# Patient Record
Sex: Female | Born: 1990 | Race: Black or African American | Hispanic: No | Marital: Married | Smoking: Former smoker
Health system: Southern US, Community
[De-identification: ages and names within clinical notes are randomized; demographics above are authoritative.]

## PROBLEM LIST (undated history)

## (undated) ENCOUNTER — Inpatient Hospital Stay (HOSPITAL_COMMUNITY): Payer: Self-pay

## (undated) ENCOUNTER — Inpatient Hospital Stay (HOSPITAL_COMMUNITY): Payer: Medicaid Other

## (undated) DIAGNOSIS — R569 Unspecified convulsions: Secondary | ICD-10-CM

## (undated) DIAGNOSIS — O039 Complete or unspecified spontaneous abortion without complication: Secondary | ICD-10-CM

## (undated) HISTORY — DX: Complete or unspecified spontaneous abortion without complication: O03.9

## (undated) HISTORY — DX: Unspecified convulsions: R56.9

---
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
## 2015-02-12 ENCOUNTER — Encounter: Payer: Self-pay | Admitting: Emergency Medicine

## 2015-02-12 ENCOUNTER — Emergency Department
Admission: EM | Admit: 2015-02-12 | Discharge: 2015-02-12 | Disposition: A | Discharge status: Home / Self Care | Payer: BLUE CROSS/BLUE SHIELD | Attending: Emergency Medicine | Admitting: Emergency Medicine

## 2015-02-12 DIAGNOSIS — Z3202 Encounter for pregnancy test, result negative: Secondary | ICD-10-CM | POA: Insufficient documentation

## 2015-02-12 DIAGNOSIS — M62830 Muscle spasm of back: Secondary | ICD-10-CM | POA: Diagnosis not present

## 2015-02-12 DIAGNOSIS — Y92410 Unspecified street and highway as the place of occurrence of the external cause: Secondary | ICD-10-CM | POA: Insufficient documentation

## 2015-02-12 DIAGNOSIS — Y998 Other external cause status: Secondary | ICD-10-CM | POA: Diagnosis not present

## 2015-02-12 DIAGNOSIS — Y9389 Activity, other specified: Secondary | ICD-10-CM | POA: Diagnosis not present

## 2015-02-12 DIAGNOSIS — F17210 Nicotine dependence, cigarettes, uncomplicated: Secondary | ICD-10-CM | POA: Insufficient documentation

## 2015-02-12 DIAGNOSIS — S3992XA Unspecified injury of lower back, initial encounter: Secondary | ICD-10-CM | POA: Diagnosis present

## 2015-02-12 LAB — POCT PREGNANCY, URINE: Preg Test, Ur: NEGATIVE

## 2015-02-12 MED ORDER — CYCLOBENZAPRINE HCL 10 MG PO TABS: 10.0000 mg | ORAL_TABLET | Freq: Three times a day (TID) | ORAL | Status: DC | PRN

## 2015-02-12 MED ORDER — MELOXICAM 15 MG PO TABS: 15.0000 mg | ORAL_TABLET | Freq: Every day | ORAL | Status: DC
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
## 2015-02-12 NOTE — ED Notes (Signed)
Urine prog. negative
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
## 2015-09-09 ENCOUNTER — Other Ambulatory Visit: Payer: Self-pay | Admitting: Obstetrics and Gynecology

## 2015-09-09 DIAGNOSIS — N979 Female infertility, unspecified: Secondary | ICD-10-CM

## 2015-09-12 ENCOUNTER — Ambulatory Visit
Admission: RE | Admit: 2015-09-12 | Discharge: 2015-09-12 | Disposition: A | Discharge status: Home / Self Care | Payer: 59 | Source: Ambulatory Visit | Attending: Obstetrics and Gynecology | Admitting: Obstetrics and Gynecology

## 2015-09-12 DIAGNOSIS — N979 Female infertility, unspecified: Secondary | ICD-10-CM | POA: Diagnosis present

## 2015-09-12 MED ORDER — IOPAMIDOL (ISOVUE-370) INJECTION 76%
20.0000 mL | Freq: Once | INTRAVENOUS | Status: AC | PRN
  Administered 2015-09-12: 10 mL

## 2015-09-12 NOTE — Procedures (Signed)
Patient presents for HSG, confirmed no iodine or shellfish allergies.  Sterile speculum placed, cervix easily visualized and normal nulliparous in appearance.  The cervix was prepped with betadine.  The HSG catheter was then introduced into the lower uterine segment and the balloon inflated.  Catheter was primed prior to placement.  Sterile speculum was removed.  39mL of 370 isoview were then injected noting a normal uterine contour, normal course and calibre of the fallopian tubes, with bilateral spill of contrast.  The catheter was removed, the patient tolerated the procedure well without complications.

## 2016-05-28 ENCOUNTER — Other Ambulatory Visit: Payer: Self-pay | Admitting: Obstetrics and Gynecology

## 2016-05-28 ENCOUNTER — Telehealth: Payer: Self-pay | Admitting: Obstetrics and Gynecology

## 2016-05-28 NOTE — Telephone Encounter (Signed)
Patient called and stated that Dr. Georgianne Fick emailed her from vacation and said for her to call the office and have someone call in her Letrozole. Please advise  [phone]

## 2016-06-01 NOTE — Telephone Encounter (Signed)
Please advise 

## 2016-06-02 NOTE — Telephone Encounter (Signed)
I left a message for patient to call me back.  Pt called back and is aware of AMS message regarding she will need to call with her next cycle.

## 2016-06-02 NOTE — Telephone Encounter (Signed)
I do not see this on her current medication list.

## 2016-06-02 NOTE — Telephone Encounter (Signed)
She missed her cycle window so if it wasn't called in will need to call with next cycle.  It's listed in her greenway

## 2016-06-02 NOTE — Telephone Encounter (Signed)
Looks like this was routed to [place] and I do not think she has seen it

## 2016-06-25 ENCOUNTER — Other Ambulatory Visit: Payer: Self-pay | Admitting: Obstetrics and Gynecology

## 2016-06-25 MED ORDER — LETROZOLE 2.5 MG PO TABS: 2.5000 mg | ORAL_TABLET | Freq: Every day | ORAL | 0 refills | Status: DC

## 2016-06-25 NOTE — Progress Notes (Signed)
Cycle II letrozole 2.5mg  LMP 06/24/16

## 2016-07-31 ENCOUNTER — Telehealth: Payer: Self-pay | Admitting: Obstetrics and Gynecology

## 2016-07-31 ENCOUNTER — Telehealth: Payer: Self-pay

## 2016-07-31 NOTE — Telephone Encounter (Signed)
Pt calling b/c she had a positive home preg test.  She wanted to know if she needed to come in for other tests.  Left detiled msg that if she has definitely missed a period, she can set up her NOB appt.

## 2016-07-31 NOTE — Telephone Encounter (Signed)
PT is already scheduled.

## 2016-08-18 ENCOUNTER — Encounter: Payer: Self-pay | Admitting: Obstetrics and Gynecology

## 2016-08-18 ENCOUNTER — Ambulatory Visit (INDEPENDENT_AMBULATORY_CARE_PROVIDER_SITE_OTHER): Payer: Commercial Managed Care - PPO

## 2016-08-18 ENCOUNTER — Ambulatory Visit (INDEPENDENT_AMBULATORY_CARE_PROVIDER_SITE_OTHER): Payer: Commercial Managed Care - PPO | Admitting: Obstetrics and Gynecology

## 2016-08-18 VITALS — BP 114/70 | Ht 60.0 in | Wt 112.0 lb

## 2016-08-18 DIAGNOSIS — N926 Irregular menstruation, unspecified: Secondary | ICD-10-CM

## 2016-08-18 LAB — POCT URINE PREGNANCY: PREG TEST UR: NEGATIVE

## 2016-08-18 NOTE — Progress Notes (Signed)
Pt on Nurse schedule today for UPT.

## 2016-08-20 NOTE — Progress Notes (Signed)
No charge appointment. Patient was to be removed from my schedule. Had pregnancy test only

## 2016-09-11 ENCOUNTER — Other Ambulatory Visit: Payer: Self-pay | Admitting: Obstetrics and Gynecology

## 2016-09-11 MED ORDER — LETROZOLE 2.5 MG PO TABS: 2.5000 mg | ORAL_TABLET | Freq: Every day | ORAL | 0 refills | Status: DC

## 2016-09-11 NOTE — Progress Notes (Signed)
LMP 09/08/16 restart letrozole 2.5mg 

## 2016-12-07 ENCOUNTER — Ambulatory Visit: Payer: Commercial Managed Care - PPO | Admitting: Certified Nurse Midwife

## 2016-12-07 ENCOUNTER — Encounter: Payer: Self-pay | Admitting: Certified Nurse Midwife

## 2016-12-07 NOTE — Progress Notes (Deleted)
Gynecology Annual Exam  PCP: Sofie Hartigan, MD  Chief Complaint: No chief complaint on file.   History of Present Illness:Diana Stuart presents today for her annual exam. She is a 26 year old Caucasian/White female, G0 P0000, whose LMP was 05/22/2015. She is having no significant problems. Her menses are regular. They occur every monthly, they last 4-5 days, are light flow, and are without clots. She has had no spotting. She denies dysmenorrhea.  The patient's past medical history is non-contributory. Since her last annual GYN exam she has had no significant changes in her health history since last exam She is sexually active and reports no problems with intercourse. She is currently uses nothing for contraception. Her most recent Pap smear was normal. She has not had a recent mammogram. There is no family history of breast cancer The patient sporadically do monthly self breast exams.  The patient does not smoke The patient does not drink The patient does not use drugs  Patient reports no symptoms of concern as it pertains to depression or anxiety. Patient is not screened any further at this time..    No history of GC/CT/PID, father has fathered prior pregnancies.     Diana Stuart is a 26 y.o. G0P0000 presents for annual exam. The patient {Blank single:19197::"has no complaints today.","complains of ***"}  Her menses are regular, they occur every month, and they last *** days. Her flow is {Blank single:19197::"light","heavy","moderate"}. She {Blank single:19197::"does","does not"} have intermenstrual bleeding. Her last menstrual period was ***. She denies dysmenorrhea. Last pap smear: ***, results were ***   The patient {Blank single:19197::"has never been","is not currently","is not","is"}  sexually active. She currently uses *** for contraception. She {Blank single:19197::"has","does not have"} dyspareunia.  {Blank single:19197::"Since her last visit, she has  ***","Since her last visit, she has had no significant changes in her health."}  Her past medical history is remarkable for ***  The patient {Blank single:19197::"does not know how to","does not","does"} perform self breast exams. Her last mammogram was ***, results were ***.   {Blank single:19197::"There is a family history of breast cancer in her ***","There is no family history of breast cancer."} Genetic testing {Blank single:19197::"has","has not"} been done.   {Blank single:19197::"There is a family history of ovarian cancer in her ***","There is no family history of ovarian cancer."} Genetic testing {Blank single:19197::"has","has not"} been done.  The patient {Blank single:19197::"reports smoking. She smokes *** packs per day.","denies smoking."}  She {Blank single:19197::"denies drinking.","reports drinking alcohol. She reports have *** drinks per week."}   She {Blank single:19197::"reports illegal drug use. She uses ***","denies illegal drug use."}  The patient {Blank single:19197::"does not exercise","reports exercising occasionally","reports exercising regularly"}.  The patient {Blank single:19197::"reports","denies"} current symptoms of depression.    Review of Systems: ROS  Past Medical History:  No past medical history on file.  Past Surgical History:  No past surgical history on file.  Family History:  No family history on file.  Social History:  Social History   Social History  . Marital status: Single    Spouse name: N/A  . Number of children: N/A  . Years of education: N/A   Occupational History  . Not on file.   Social History Main Topics  . Smoking status: Former Smoker    Types: Cigarettes  . Smokeless tobacco: Never Used  . Alcohol use No  . Drug use: No  . Sexual activity: Yes    Birth control/ protection: None   Other Topics Concern  .  Not on file   Social History Narrative  . No narrative on file    Allergies:  Allergies    Allergen Reactions  . Grape Flavor [Grape (Artificial) Flavor] Hives    Medications: Prior to Admission medications   Medication Sig Start Date End Date Taking? Authorizing Provider  cyclobenzaprine (FLEXERIL) 10 MG tablet Take 1 tablet (10 mg total) by mouth 3 (three) times daily as needed for muscle spasms. Patient not taking: Reported on 08/18/2016 02/12/15   Cuthriell, Charline Bills, PA-C  meloxicam (MOBIC) 15 MG tablet Take 1 tablet (15 mg total) by mouth daily. Patient not taking: Reported on 08/18/2016 02/12/15   Cuthriell, Charline Bills, PA-C    Physical Exam Vitals: There were no vitals taken for this visit.  General: NAD HEENT: normocephalic, anicteric Neck: no thyroid enlargement, no palpable nodules, no cervical lymphadenopathy  Pulmonary: No increased work of breathing, CTAB Cardiovascular: RRR, {Blank single:19197::"with murmur","without murmur"}  Breast: Breast symmetrical, no tenderness, no palpable nodules or masses, no skin or nipple retraction present, no nipple discharge.  No axillary, infraclavicular or supraclavicular lymphadenopathy. Abdomen: Soft, non-tender, non-distended.  Umbilicus without lesions.  No hepatomegaly or masses palpable. No evidence of hernia. Genitourinary:  External: Normal external female genitalia.  Normal urethral meatus, normal  Bartholin's and Skene's glands.    Vagina: Normal vaginal mucosa, no evidence of prolapse.    Cervix: Grossly normal in appearance, no bleeding, non-tender  Uterus: Anteverted, normal size, shape, and consistency, mobile, and non-tender  Adnexa: No adnexal masses, non-tender  Rectal: deferred  Lymphatic: no evidence of inguinal lymphadenopathy Extremities: no edema, erythema, or tenderness Neurologic: Grossly intact Psychiatric: mood appropriate, affect full     Assessment: 26 y.o. G0P0000 No problem-specific Assessment & Plan notes found for this encounter.   Plan:  ***  1) Breast cancer screening -  recommend monthly self breast exam. {Blank single:19197::" ","Mammogram is up to date.","Mammogram was ordered today."}  2) STI screening was offered and {Blank single:19197::"accepted","declined"}.  3) Cervical cancer screening - {Blank single:19197::"Pap smear due in *** years","Pap not indicated","Pap was done"}. ASCCP guidelines and rational discussed.  Patient opts for {Blank single:19197::"every 5 years","every 3 years","yearly"} screening interval  4) Contraception - Education given regarding options for contraception  5) Routine healthcare maintenance including cholesterol and diabetes screening {Blank single:19197::"declined","managed by PCP","ordered today"}

## 2016-12-24 ENCOUNTER — Other Ambulatory Visit: Payer: Self-pay | Admitting: Obstetrics and Gynecology

## 2016-12-24 MED ORDER — LETROZOLE 2.5 MG PO TABS: 2.5000 mg | ORAL_TABLET | Freq: Every day | ORAL | 0 refills | Status: DC

## 2016-12-24 NOTE — Progress Notes (Signed)
LMP 12/23/16 Cycle I letrozole 2.5mg  following miscarriage

## 2017-01-20 ENCOUNTER — Other Ambulatory Visit: Payer: Self-pay | Admitting: Obstetrics and Gynecology

## 2017-01-20 MED ORDER — LETROZOLE 2.5 MG PO TABS: 2.5000 mg | ORAL_TABLET | Freq: Every day | ORAL | 0 refills | Status: AC

## 2017-01-20 NOTE — Progress Notes (Signed)
Cycle II letrozole 2.5mg  LMP 01/19/17

## 2017-02-15 ENCOUNTER — Other Ambulatory Visit: Payer: Self-pay | Admitting: Obstetrics and Gynecology

## 2017-02-15 MED ORDER — LETROZOLE 2.5 MG PO TABS: 2.5000 mg | ORAL_TABLET | Freq: Every day | ORAL | 0 refills | Status: DC

## 2017-02-15 NOTE — Progress Notes (Signed)
LMP 02/13/17 letrzole 2.5mg 

## 2017-03-12 ENCOUNTER — Other Ambulatory Visit: Payer: Self-pay | Admitting: Obstetrics and Gynecology

## 2017-03-12 MED ORDER — LETROZOLE 2.5 MG PO TABS: 2.5000 mg | ORAL_TABLET | Freq: Every day | ORAL | 0 refills | Status: AC

## 2017-03-12 NOTE — Progress Notes (Signed)
Cycle IV letrozole 2.5mg  LMP 03/11/16

## 2017-05-07 ENCOUNTER — Encounter: Payer: Self-pay | Admitting: Obstetrics and Gynecology

## 2017-05-07 ENCOUNTER — Ambulatory Visit (INDEPENDENT_AMBULATORY_CARE_PROVIDER_SITE_OTHER): Payer: Commercial Managed Care - PPO | Admitting: Obstetrics and Gynecology

## 2017-05-07 VITALS — BP 112/64 | HR 94 | Ht 60.0 in | Wt 120.0 lb

## 2017-05-07 DIAGNOSIS — N926 Irregular menstruation, unspecified: Secondary | ICD-10-CM | POA: Diagnosis not present

## 2017-05-07 DIAGNOSIS — N912 Amenorrhea, unspecified: Secondary | ICD-10-CM | POA: Diagnosis not present

## 2017-05-07 NOTE — Progress Notes (Signed)
   Patient ID: Diana Stuart, female   DOB: 03-Jul-1990, 27 y.o.   MRN: [id number]  Reason for Consult: Amenorrhea (Positive home pregnancy test/negative in office)   Referred by Sofie Hartigan, MD  Subjective:     HPI:  Diana Stuart is a 27 y.o. female patient was being followed for infertility for 2 years. She had plans to do an IUI after this cycle, but her period never came. She took a home pregnancy test and it was positive. Test here today is negative.  She denies vaginal bleeding. She reports regular menses every 27 days. She reports breast tenderness.   History reviewed. No pertinent past medical history. Family History  Problem Relation Age of Onset  . Lung cancer Maternal Grandfather 60  . Breast cancer Paternal Grandmother 19   History reviewed. No pertinent surgical history.  Short Social History:  Social History   Tobacco Use  . Smoking status: Former Smoker    Types: Cigarettes  . Smokeless tobacco: Never Used  Substance Use Topics  . Alcohol use: Yes    Allergies  Allergen Reactions  . Grape Flavor [Grape (Artificial) Flavor] Hives    No current outpatient medications on file.   No current facility-administered medications for this visit.     Review of Systems  Constitutional: Negative for chills, fatigue, fever and unexpected weight change.  HENT: Negative for trouble swallowing.  Eyes: Negative for loss of vision.  Respiratory: Negative for cough, shortness of breath and wheezing.  Cardiovascular: Negative for chest pain, leg swelling, palpitations and syncope.  GI: Negative for abdominal pain, blood in stool, diarrhea, nausea and vomiting.  GU: Negative for difficulty urinating, dysuria, frequency and hematuria.  Musculoskeletal: Negative for back pain, leg pain and joint pain.  Skin: Negative for rash.  Neurological: Negative for dizziness, headaches, light-headedness, numbness and seizures.  Psychiatric: Negative for behavioral  problem, confusion, depressed mood and sleep disturbance.        Objective:  Objective   Vitals:   05/07/17 1521  BP: 112/64  Pulse: 94  Weight: 120 lb (54.4 kg)  Height: 5' (1.524 m)   Body mass index is 23.44 kg/m.  Physical Exam  Constitutional: She is oriented to person, place, and time. She appears well-developed and well-nourished.  HENT:  Head: Normocephalic and atraumatic.  Eyes: EOM are normal.  Cardiovascular: Normal rate and regular rhythm.  Pulmonary/Chest: Effort normal.  Neurological: She is alert and oriented to person, place, and time.  Skin: Skin is warm and dry.  Psychiatric: She has a normal mood and affect. Her behavior is normal. Judgment and thought content normal.  Nursing note and vitals reviewed.       Assessment/Plan:    26yo G0P0 Amenorrhea, possibly early pregnancy. Will obtain beta hcg and progesterone level.    [facility] 05/07/17 3:34 PM

## 2017-05-08 LAB — PROGESTERONE: Progesterone: 3.7 ng/mL

## 2017-05-08 LAB — BETA HCG QUANT (REF LAB): HCG QUANT: 170 m[IU]/mL

## 2017-05-09 ENCOUNTER — Other Ambulatory Visit: Payer: Self-pay | Admitting: Obstetrics and Gynecology

## 2017-05-09 DIAGNOSIS — Z3490 Encounter for supervision of normal pregnancy, unspecified, unspecified trimester: Secondary | ICD-10-CM

## 2017-05-09 NOTE — Progress Notes (Signed)
Will have patient repeat beta on 05/09/17. Discussed result with patient. Progesterone is low, patient has had 2 years of infertility, consider 12 weeks of progesterone supplementation if beta hcg rises appropriately.  Discussed results with patient on phone.

## 2017-05-10 ENCOUNTER — Other Ambulatory Visit: Payer: Commercial Managed Care - PPO

## 2017-05-10 ENCOUNTER — Telehealth: Payer: Self-pay | Admitting: Obstetrics and Gynecology

## 2017-05-10 ENCOUNTER — Other Ambulatory Visit: Payer: Self-pay | Admitting: Obstetrics and Gynecology

## 2017-05-10 ENCOUNTER — Telehealth: Payer: Self-pay

## 2017-05-10 DIAGNOSIS — N912 Amenorrhea, unspecified: Secondary | ICD-10-CM

## 2017-05-10 DIAGNOSIS — Z3490 Encounter for supervision of normal pregnancy, unspecified, unspecified trimester: Secondary | ICD-10-CM

## 2017-05-10 NOTE — Telephone Encounter (Signed)
Pt had blood work done Friday and today.  Can she have rx for progesterone supplement so maybe she could stop spotting?  [phone]

## 2017-05-10 NOTE — Telephone Encounter (Signed)
Pt can come here since she did not go to the hospital over the weekend to obtain a trending beta level. Please contact her and have her put on the lab schedule. There is already an order for her available. Pt needs to come today. Thank you.

## 2017-05-10 NOTE — Progress Notes (Signed)
Updated lab order for beta HCG to be drawn at [facility], previous order for hospital only.

## 2017-05-10 NOTE — Telephone Encounter (Signed)
Pt is calling due to being told she needs labs done. Was told to go to hospital and they would do the draw there. Pt is requesting to come here to have them done. Please advise

## 2017-05-10 NOTE — Telephone Encounter (Signed)
Pt has been schedule 05/10/17 at 9:20

## 2017-05-11 LAB — BETA HCG QUANT (REF LAB): hCG Quant: 56 m[IU]/mL

## 2017-05-12 ENCOUNTER — Ambulatory Visit: Payer: Commercial Managed Care - PPO | Admitting: Obstetrics and Gynecology

## 2017-05-12 NOTE — Progress Notes (Signed)
Discussed result with patient over the phone and that this was consistent with a non viable pregnancy. Will follow beta hcg to zero, repeat labs next week.

## 2017-07-16 NOTE — Telephone Encounter (Signed)
Pt was no show on 05/12/17.

## 2018-04-29 ENCOUNTER — Encounter: Payer: Self-pay | Admitting: Advanced Practice Midwife

## 2018-04-29 ENCOUNTER — Other Ambulatory Visit (HOSPITAL_COMMUNITY)
Admission: RE | Admit: 2018-04-29 | Discharge: 2018-04-29 | Disposition: A | Discharge status: Home / Self Care | Payer: Medicaid Other | Source: Ambulatory Visit | Attending: Advanced Practice Midwife | Admitting: Advanced Practice Midwife

## 2018-04-29 ENCOUNTER — Ambulatory Visit (INDEPENDENT_AMBULATORY_CARE_PROVIDER_SITE_OTHER): Payer: Medicaid Other | Admitting: Advanced Practice Midwife

## 2018-04-29 VITALS — BP 122/70 | Wt 125.0 lb

## 2018-04-29 DIAGNOSIS — Z124 Encounter for screening for malignant neoplasm of cervix: Secondary | ICD-10-CM | POA: Diagnosis not present

## 2018-04-29 DIAGNOSIS — O0990 Supervision of high risk pregnancy, unspecified, unspecified trimester: Secondary | ICD-10-CM | POA: Insufficient documentation

## 2018-04-29 DIAGNOSIS — Z113 Encounter for screening for infections with a predominantly sexual mode of transmission: Secondary | ICD-10-CM | POA: Diagnosis not present

## 2018-04-29 DIAGNOSIS — Z3480 Encounter for supervision of other normal pregnancy, unspecified trimester: Secondary | ICD-10-CM

## 2018-04-29 DIAGNOSIS — Z3481 Encounter for supervision of other normal pregnancy, first trimester: Secondary | ICD-10-CM

## 2018-04-29 DIAGNOSIS — Z3A10 10 weeks gestation of pregnancy: Secondary | ICD-10-CM

## 2018-04-29 DIAGNOSIS — Z1379 Encounter for other screening for genetic and chromosomal anomalies: Secondary | ICD-10-CM

## 2018-04-29 NOTE — Progress Notes (Signed)
NOB today. LMP: 02/17/2018

## 2018-04-29 NOTE — Progress Notes (Signed)
New Obstetric Patient H&P    Chief Complaint: "Desires prenatal care"   History of Present Illness: Patient is a 28 y.o. [id number] Not Hispanic or Latino female, presents with amenorrhea and positive home pregnancy test. Patient's last menstrual period was 02/17/2018 (exact date). and based on her  LMP, her EDD is Estimated Date of Delivery: 11/24/18 and her EGA is [redacted]w[redacted]d. Cycles are 4. days, regular, and occur approximately every : 28 days. Her last pap smear was 3 years ago and was no abnormalities.    She had a urine pregnancy test which was positive 6 week(s)  ago. Her last menstrual period was normal and lasted for  4 day(s). Since her LMP she claims she has experienced breast tenderness, fatigue, nausea, vomiting. She denies vaginal bleeding. Her past medical history is noncontributory. 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  There are cats in the home in the home  no  She admits close contact with children on a regular basis  no  She has had chicken pox in the past yes She has had Tuberculosis exposures, symptoms, or previously tested positive for TB   no Current or past history of domestic violence. no  Genetic Screening/Teratology Counseling: (Includes patient, baby's father, or anyone in either family with:)   93. Patient's age >/= 79 [facility]  no 2. Thalassemia ([place], [place], [place], or Asian background): MCV<80  no 3. Neural tube defect (meningomyelocele, spina bifida, anencephaly)  no 4. Congenital heart defect  no  5. Down syndrome  no 6. Tay-Sachs (Jewish, [place])  no 7. Canavan's Disease  no 8. Sickle cell disease or trait (African)  no  9. Hemophilia or other blood disorders  no  10. Muscular dystrophy  no  11. Cystic fibrosis   no  12. Huntington's Chorea  no  13. Mental retardation/autism  no 14. Other inherited genetic or chromosomal disorder  no 15. Maternal metabolic disorder (DM, PKU, etc)  no 16. Patient or FOB with a child with a birth defect not listed above no  16a. Patient or FOB with a birth defect themselves no 17. Recurrent pregnancy loss, or stillbirth  no  18. Any medications since LMP other than prenatal vitamins (include vitamins, supplements, OTC meds, drugs, alcohol)  no 19. Any other genetic/environmental exposure to discuss  no  Infection History:   1. Lives with someone with TB or TB exposed  no  2. Patient or partner has history of genital herpes  no 3. Rash or viral illness since LMP  no 4. History of STI (GC, CT, HPV, syphilis, HIV)  no 5. History of recent travel :  no  Other pertinent information:  Patient has history of seizures at age 33. She has never been on medication and has not had any seizures since then.  She plans a trip to [place] in May and is counseled regarding protection against mosquito bites    Review of Systems:10 point review of systems negative unless otherwise noted in HPI  Past Medical History:  Past Medical History:  Diagnosis Date  . Miscarriage   . Seizure [facility])     Past Surgical History:  History reviewed. No pertinent surgical history.  Gynecologic History: Patient's last menstrual period was  02/17/2018 (exact date).  Obstetric History: G3P0020   Family History:  Family History  Problem Relation Age of Onset  . Lung cancer Maternal Grandfather 60  . Breast cancer Paternal Grandmother 64    Social History:  Social History   Socioeconomic History  . Marital status: Single    Spouse name: Not on file  . Number of children: Not on file  . Years of education: Not on file  . Highest education level: Not on file  Occupational History  . Not on file  Social Needs  . Financial resource strain: Not on file  . Food insecurity:    Worry:  Not on file    Inability: Not on file  . Transportation needs:    Medical: Not on file    Non-medical: Not on file  Tobacco Use  . Smoking status: Former Smoker    Types: Cigarettes  . Smokeless tobacco: Never Used  Substance and Sexual Activity  . Alcohol use: Yes  . Drug use: No  . Sexual activity: Yes    Birth control/protection: None  Lifestyle  . Physical activity:    Days per week: Not on file    Minutes per session: Not on file  . Stress: Not on file  Relationships  . Social connections:    Talks on phone: Not on file    Gets together: Not on file    Attends religious service: Not on file    Active member of club or organization: Not on file    Attends meetings of clubs or organizations: Not on file    Relationship status: Not on file  . Intimate partner violence:    Fear of current or ex partner: Not on file    Emotionally abused: Not on file    Physically abused: Not on file    Forced sexual activity: Not on file  Other Topics Concern  . Not on file  Social History Narrative  . Not on file    Allergies:  Allergies  Allergen Reactions  . Grape Flavor [Grape (Artificial) Flavor] Hives    Medications: Prior to Admission medications   Medication Sig Start Date End Date Taking? Authorizing Provider  Prenatal Vit-Fe Fumarate-FA (MULTIVITAMIN-PRENATAL) 27-0.8 MG TABS tablet Take 1 tablet by mouth daily at 12 noon.   Yes [provider]    Physical Exam Vitals: Blood pressure 122/70, weight 125 lb (56.7 kg), last menstrual period 02/17/2018.  General: NAD HEENT: normocephalic, anicteric Thyroid: no enlargement, no palpable nodules Pulmonary: No increased work of breathing, CTAB Cardiovascular: RRR, distal pulses 2+ Abdomen: NABS, soft, non-tender, non-distended.  Umbilicus without lesions.  No hepatomegaly, splenomegaly or masses palpable. No evidence of hernia  Genitourinary:  External: Normal external female genitalia.  Normal urethral meatus,  normal  Bartholin's and Skene's glands.    Vagina: Normal vaginal mucosa, no evidence of prolapse.    Cervix: Grossly normal in appearance, no bleeding, no CMT  Uterus: Enlarged, mobile, normal contour.    Adnexa: ovaries non-enlarged, no adnexal masses  Rectal: deferred Extremities: no edema, erythema, or tenderness Neurologic: Grossly intact Psychiatric: mood appropriate, affect full   Assessment: 28 y.o. G3P0020 at [redacted]w[redacted]d by exact LMP presenting to initiate prenatal care  Plan: 1) Avoid alcoholic beverages. 2) Patient encouraged not to smoke.  3) Discontinue the use of all non-medicinal drugs and chemicals.  4) Take prenatal vitamins daily.  5) Nutrition, food safety (fish, cheese advisories, and high nitrite foods) and exercise discussed. 6) Hospital and practice style  discussed with cross coverage system.  7) Genetic Screening, such as with 1st Trimester Screening, cell free fetal DNA, AFP testing, and Ultrasound, as well as with amniocentesis and CVS as appropriate, is discussed with patient. At the conclusion of today's visit patient declined genetic testing 8) Patient is asked about travel to areas at risk for the [place] virus, and counseled to avoid travel and exposure to mosquitoes or sexual partners who may have themselves been exposed to the virus. Testing is discussed, and will be ordered as appropriate.  9) NOB labs today 10) PAPtima today 11) Return in 1 week for dating and ROB   Rod Can, [place] 04/29/2018, 12:52 PM

## 2018-04-29 NOTE — Patient Instructions (Addendum)
Exercise During Pregnancy For people of all ages, exercise is an important part of being healthy. Exercise improves heart and lung function and helps to maintain strength, flexibility, and a healthy body weight. Exercise also boosts energy levels and elevates mood. For most women, maintaining an exercise routine throughout pregnancy is recommended. It is only on rare occasions and with certain medical conditions or pregnancy complications that women may be asked to limit or avoid exercise during pregnancy. What are some other benefits to exercising during pregnancy? Along with maintaining strength and flexibility, exercising throughout pregnancy can help to:  Keep strength in muscles that are very important during labor and childbirth.  Decrease low back pain during pregnancy.  Decrease the risk of developing gestational diabetes mellitus (GDM).  Improve blood sugar (glucose) control for women who have GDM.  Decrease the risk of developing preeclampsia. This is a serious condition that causes high blood pressure along with other symptoms, such as swelling and headaches.  Decrease the risk of cesarean delivery.  Speed up the recovery after giving birth. How often should I exercise? Unless your health care provider gives you different instructions, you should try to exercise on most days or all days of the week. In general, try to exercise with moderate intensity for about 150 minutes per week. This can be spread out across several days, such as exercising for 30 minutes per day on 5 days of each week. You can tell that you are exercising at a moderate intensity if you have a higher heart rate and faster breathing, but you are still able to hold a conversation. What types of moderate-intensity exercise are recommended during pregnancy? There are many types of exercise that are safe for you to do during pregnancy. Unless your health care provider gives you different instructions, do a variety of  exercises that safely increase your heart and breathing (cardiopulmonary) rates and help you to build and maintain muscle strength (strength training). You should always be able to talk in full sentences while exercising during pregnancy. Some examples of exercising that is safe to do during pregnancy include:  Brisk walking or hiking.  Swimming.  Water aerobics.  Riding a stationary bike.  Strength training.  Modified yoga or Pilates. Tell your instructor that you are pregnant. Avoid overstretching and avoid lying on your back for long periods of time.  Running or jogging. Only choose this type of exercise if: ? You ran or jogged regularly before your pregnancy. ? You can run or jog and still talk in complete sentences. What types of exercise should I not do during pregnancy? Depending on your level of fitness and whether you exercised regularly before your pregnancy, you may be advised to limit vigorous-intensity exercise during your pregnancy. You can tell that you are exercising at a vigorous intensity if you are breathing much harder and faster and cannot hold a conversation while exercising. Some examples of exercising that you should avoid during pregnancy include:  Contact sports.  Activities that place you at risk for falling on or being hit in the belly, such as downhill skiing, water skiing, surfing, rock climbing, cycling, gymnastics, and horseback riding.  Scuba diving.  Sky diving.  Yoga or Pilates in a room that is heated to extreme temperatures ("hot yoga" or "hot Pilates").  Jogging or running, unless you ran or jogged regularly before your pregnancy. While jogging or running, you should always be able to talk in full sentences. Do not run or jog so vigorously that you  are unable to have a conversation.  If you are not used to exercising at elevation (more than 6,000 feet above sea level), do not do so during your pregnancy. When should I avoid exercising during  pregnancy? Certain medical conditions can make it unsafe to exercise during pregnancy, or they may increase your risk of miscarriage or early labor and birth. Some of these conditions include:  Some types of heart disease.  Some types of lung disease.  Placenta previa. This is when the placenta partially or completely covers the opening of the uterus (cervix).  Frequent bleeding from the vagina during your pregnancy.  Incompetent cervix. This is when your cervix does not remain as tightly closed during pregnancy as it should.  Premature labor.  Ruptured membranes. This is when the protective sac (amniotic sac) opens up and amniotic fluid leaks from your vagina.  Severely low blood count (anemia).  Preeclampsia or pregnancy-caused high blood pressure.  Carrying more than one baby (multiple gestation) and having an additional risk of early labor.  Poorly controlled diabetes.  Being severely underweight or severely overweight.  Intrauterine growth restriction. This is when your baby's growth and development during pregnancy are slower than expected.  Other medical conditions. Ask your health care provider if any apply to you. What else should I know about exercising during pregnancy? You should take these precautions while exercising during pregnancy:  Avoid overheating. ? Wear loose-fitting, breathable clothes. ? Do not exercise in very high temperatures.  Avoid dehydration. Drink enough water before, during, and after exercise to keep your urine clear or pale yellow.  Avoid overstretching. Because of hormone changes during pregnancy, it is easy to overstretch muscles, tendons, and ligaments during pregnancy.  Start slowly and ask your health care provider to recommend types of exercise that are safe for you, if exercising regularly is new for you. Pregnancy is not a time for exercising to lose weight. When should I seek medical care? You should stop exercising and call your  health care provider if you have any unusual symptoms, such as:  Mild uterine contractions or abdominal cramping.  Dizziness that does not improve with rest. When should I seek immediate medical care? You should stop exercising and call your local emergency services (911 in the [place] if you have any unusual symptoms, such as:  Sudden, severe pain in your low back or your belly.  Uterine contractions or abdominal cramping that do not improve with rest.  Chest pain.  Bleeding or fluid leaking from your vagina.  Shortness of breath. This information is not intended to replace advice given to you by your health care provider. Make sure you discuss any questions you have with your health care provider. Document Released: 02/16/2005 Document Revised: 07/17/2015 Document Reviewed: 04/26/2014 Elsevier Interactive Patient Education  2019 [place] for Pregnant Women While you are pregnant, your body requires additional nutrition to help support your growing baby. You also have a higher need for some vitamins and minerals, such as folic acid, calcium, iron, and vitamin D. Eating a healthy, well-balanced diet is very important for your health and your baby's health. Your need for extra calories varies for the three 27-month segments of your pregnancy (trimesters). For most women, it is recommended to consume:  150 extra calories a day during the first trimester.  300 extra calories a day during the second trimester.  300 extra calories a day during the third trimester. What are tips for following this plan?   Do  not try to lose weight or go on a diet during pregnancy.  Limit your overall intake of foods that have "empty calories." These are foods that have little nutritional value, such as sweets, desserts, candies, and sugar-sweetened beverages.  Eat a variety of foods (especially fruits and vegetables) to get a full range of vitamins and minerals.  Take a prenatal vitamin  to help meet your additional vitamin and mineral needs during pregnancy, specifically for folic acid, iron, calcium, and vitamin D.  Remember to stay active. Ask your health care provider what types of exercise and activities are safe for you.  Practice good food safety and cleanliness. Wash your hands before you eat and after you prepare raw meat. Wash all fruits and vegetables well before peeling or eating. Taking these actions can help to prevent food-borne illnesses that can be very dangerous to your baby, such as listeriosis. Ask your health care provider for more information about listeriosis. What does 150 extra calories look like? Healthy options that provide 150 extra calories each day could be any of the following:  6-8 oz (170-230 g) of plain low-fat yogurt with  cup of berries.  1 apple with 2 teaspoons (11 g) of peanut butter.  Cut-up vegetables with  cup (60 g) of hummus.  8 oz (230 mL) or 1 cup of low-fat chocolate milk.  1 stick of string cheese with 1 medium orange.  1 peanut butter and jelly sandwich that is made with one slice of whole-wheat bread and 1 tsp (5 g) of peanut butter. For 300 extra calories, you could eat two of those healthy options each day. What is a healthy amount of weight to gain? The right amount of weight gain for you is based on your BMI before you became pregnant. If your BMI:  Was less than 18 (underweight), you should gain 28-40 lb (13-18 kg).  Was 18-24.9 (normal), you should gain 25-35 lb (11-16 kg).  Was 25-29.9 (overweight), you should gain 15-25 lb (7-11 kg).  Was 30 or greater (obese), you should gain 11-20 lb (5-9 kg). What if I am having twins or multiples? Generally, if you are carrying twins or multiples:  You may need to eat 300-600 extra calories a day.  The recommended range for total weight gain is 25-54 lb (11-25 kg), depending on your BMI before pregnancy.  Talk with your health care provider to find out about  nutritional needs, weight gain, and exercise that is right for you. What foods can I eat?  Grains All grains. Choose whole grains, such as whole-wheat bread, oatmeal, or brown rice. Vegetables All vegetables. Eat a variety of colors and types of vegetables. Remember to wash your vegetables well before peeling or eating. Fruits All fruits. Eat a variety of colors and types of fruit. Remember to wash your fruits well before peeling or eating. Meats and other protein foods Lean meats, including chicken, [place], fish, and lean cuts of beef, veal, or pork. If you eat fish or seafood, choose options that are higher in omega-3 fatty acids and lower in mercury, such as salmon, herring, mussels, trout, sardines, pollock, shrimp, crab, and lobster. Tofu. Tempeh. Beans. Eggs. Peanut butter and other nut butters. Make sure that all meats, poultry, and eggs are cooked to food-safe temperatures or "well-done." Two or more servings of fish are recommended each week in order to get the most benefits from omega-3 fatty acids that are found in seafood. Choose fish that are lower in mercury. You can  find more information online:  [employer] Dairy Pasteurized milk and milk alternatives (such as almond milk). Pasteurized yogurt and pasteurized cheese. Cottage cheese. Sour cream. Beverages Water. Juices that contain 100% fruit juice or vegetable juice. Caffeine-free teas and decaffeinated coffee. Drinks that contain caffeine are okay to drink, but it is better to avoid caffeine. Keep your total caffeine intake to less than 200 mg each day (which is 12 oz or 355 mL of coffee, tea, or soda) or the limit as told by your health care provider. Fats and oils Fats and oils are okay to include in moderation. Sweets and desserts Sweets and desserts are okay to include in moderation. Seasoning and other foods All pasteurized condiments. The items listed above may not be a complete list of recommended foods and beverages.  Contact your dietitian for more options. What foods are not recommended? Vegetables Raw (unpasteurized) vegetable juices. Fruits Unpasteurized fruit juices. Meats and other protein foods Lunch meats, bologna, hot dogs, or other deli meats. (If you must eat those meats, reheat them until they are steaming hot.) Refrigerated pat, meat spreads from a meat counter, smoked seafood that is found in the refrigerated section of a store. Raw or undercooked meats, poultry, and eggs. Raw fish, such as sushi or sashimi. Fish that have high mercury content, such as tilefish, shark, swordfish, and king mackerel. To learn more about mercury in fish, talk with your health care provider or look for online resources, such as:  [place] Dairy Raw (unpasteurized) milk and any foods that have raw milk in them. Soft cheeses, such as feta, queso blanco, queso fresco, Brie, Camembert cheeses, blue-veined cheeses, and Panela cheese (unless it is made with pasteurized milk, which must be stated on the label). Beverages Alcohol. Sugar-sweetened beverages, such as sodas, teas, or energy drinks. Seasoning and other foods Homemade fermented foods and drinks, such as pickles, sauerkraut, or kombucha drinks. (Store-bought pasteurized versions of these are okay.) Salads that are made in a store or deli, such as ham salad, chicken salad, egg salad, tuna salad, and seafood salad. The items listed above may not be a complete list of foods and beverages to avoid. Contact your dietitian for more information. Where to find more information To calculate the number of calories you need based on your height, weight, and activity level, you can use an online calculator such as:  MobileTransition.ch To calculate how much weight you should gain during pregnancy, you can use an online pregnancy weight gain calculator such as:  StreamingFood.com.cy Summary  While you are pregnant,  your body requires additional nutrition to help support your growing baby.  Eat a variety of foods, especially fruits and vegetables to get a full range of vitamins and minerals.  Practice good food safety and cleanliness. Wash your hands before you eat and after you prepare raw meat. Wash all fruits and vegetables well before peeling or eating. Taking these actions can help to prevent food-borne illnesses, such as listeriosis, that can be very dangerous to your baby.  Do not eat raw meat or fish. Do not eat fish that have high mercury content, such as tilefish, shark, swordfish, and king mackerel. Do not eat unpasteurized (raw) dairy.  Take a prenatal vitamin to help meet your additional vitamin and mineral needs during pregnancy, specifically for folic acid, iron, calcium, and vitamin D. This information is not intended to replace advice given to you by your health care provider. Make sure you discuss any questions you have with your health care  provider. Document Released: 12/01/2013 Document Revised: 11/13/2016 Document Reviewed: 11/13/2016 Elsevier Interactive Patient Education  2019 [employer]. Prenatal Care Prenatal care is health care during pregnancy. It helps you and your unborn baby (fetus) stay as healthy as possible. Prenatal care may be provided by a midwife, a family practice health care provider, or a childbirth and pregnancy specialist (obstetrician). How does this affect me? During pregnancy, you will be closely monitored for any new conditions that might develop. To lower your risk of pregnancy complications, you and your health care provider will talk about any underlying conditions you have. How does this affect my baby? Early and consistent prenatal care increases the chance that your baby will be healthy during pregnancy. Prenatal care lowers the risk that your baby will be:  Born early (prematurely).  Smaller than expected at birth (small for gestational age). What  can I expect at the first prenatal care visit? Your first prenatal care visit will likely be the longest. You should schedule your first prenatal care visit as soon as you know that you are pregnant. Your first visit is a good time to talk about any questions or concerns you have about pregnancy. At your visit, you and your health care provider will talk about:  Your medical history, including: ? Any past pregnancies. ? Your family's medical history. ? The baby's father's medical history. ? Any long-term (chronic) health conditions you have and how you manage them. ? Any surgeries or procedures you have had. ? Any current over-the-counter or prescription medicines, herbs, or supplements you are taking.  Other factors that could pose a risk to your baby, including:  Your home setting and your stress levels, including: ? Exposure to abuse or violence. ? Household financial strain. ? Mental health conditions you have.  Your daily health habits, including diet and exercise. Your health care provider will also:  Measure your weight, height, and blood pressure.  Do a physical exam, including a pelvic and breast exam.  Perform blood tests and urine tests to check for: ? Urinary tract infection. ? Sexually transmitted infections (STIs). ? Low iron levels in your blood (anemia). ? Blood type and certain proteins on red blood cells (Rh antibodies). ? Infections and immunity to viruses, such as hepatitis B and rubella. ? HIV (human immunodeficiency virus).  Do an ultrasound to confirm your baby's growth and development and to help predict your estimated due date (EDD). This ultrasound is done with a probe that is inserted into the vagina (transvaginal ultrasound).  Discuss your options for genetic screening.  Give you information about how to keep yourself and your baby healthy, including: ? Nutrition and taking vitamins. ? Physical activity. ? How to manage pregnancy symptoms such as  nausea and vomiting (morning sickness). ? Infections and substances that may be harmful to your baby and how to avoid them. ? Food safety. ? Dental care. ? Working. ? Travel. ? Warning signs to watch for and when to call your health care provider. How often will I have prenatal care visits? After your first prenatal care visit, you will have regular visits throughout your pregnancy. The visit schedule is often as follows:  Up to week 28 of pregnancy: once every 4 weeks.  28-36 weeks: once every 2 weeks.  After 36 weeks: every week until delivery. Some women may have visits more or less often depending on any underlying health conditions and the health of the baby. Keep all follow-up and prenatal care visits as told by  your health care provider. This is important. What happens during routine prenatal care visits? Your health care provider will:  Measure your weight and blood pressure.  Check for fetal heart sounds.  Measure the height of your uterus in your abdomen (fundal height). This may be measured starting around week 20 of pregnancy.  Check the position of your baby inside your uterus.  Ask questions about your diet, sleeping patterns, and whether you can feel the baby move.  Review warning signs to watch for and signs of labor.  Ask about any pregnancy symptoms you are having and how you are dealing with them. Symptoms may include: ? Headaches. ? Nausea and vomiting. ? Vaginal discharge. ? Swelling. ? Fatigue. ? Constipation. ? Any discomfort, including back or pelvic pain. Make a list of questions to ask your health care provider at your routine visits. What tests might I have during prenatal care visits? You may have blood, urine, and imaging tests throughout your pregnancy, such as:  Urine tests to check for glucose, protein, or signs of infection.  Glucose tests to check for a form of diabetes that can develop during pregnancy (gestational diabetes mellitus).  This is usually done around week 24 of pregnancy.  An ultrasound to check your baby's growth and development and to check for birth defects. This is usually done around week 20 of pregnancy.  A test to check for group B strep (GBS) infection. This is usually done around week 36 of pregnancy.  Genetic testing. This may include blood or imaging tests, such as an ultrasound. Some genetic tests are done during the first trimester and some are done during the second trimester. What else can I expect during prenatal care visits? Your health care provider may recommend getting certain vaccines during pregnancy. These may include:  A yearly flu shot (annual influenza vaccine). This is especially important if you will be pregnant during flu season.  Tdap (tetanus, diphtheria, pertussis) vaccine. Getting this vaccine during pregnancy can protect your baby from whooping cough (pertussis) after birth. This vaccine may be recommended between weeks 27 and 36 of pregnancy. Later in your pregnancy, your health care provider may give you information about:  Childbirth and breastfeeding classes.  Choosing a health care provider for your baby.  Umbilical cord banking.  Breastfeeding.  Birth control after your baby is born.  The hospital labor and delivery unit and how to tour it.  Registering at the hospital before you go into labor. Where to find more information  Office on Women's Health: LegalWarrants.gl  American Pregnancy Association: americanpregnancy.org  March of Dimes: marchofdimes.org Summary  Prenatal care helps you and your baby stay as healthy as possible during pregnancy.  Your first prenatal care visit will most likely be the longest.  You will have visits and tests throughout your pregnancy to monitor your health and your baby's health.  Bring a list of questions to your visits to ask your health care provider.  Make sure to keep all follow-up and prenatal care visits with  your health care provider. This information is not intended to replace advice given to you by your health care provider. Make sure you discuss any questions you have with your health care provider. Document Released: 02/19/2003 Document Revised: 02/15/2017 Document Reviewed: 02/15/2017 Elsevier Interactive Patient Education  2019 [employer].   Breastfeeding  Choosing to breastfeed is one of the best decisions you can make for yourself and your baby. A change in hormones during pregnancy causes your breasts  to make breast milk in your milk-producing glands. Hormones prevent breast milk from being released before your baby is born. They also prompt milk flow after birth. Once breastfeeding has begun, thoughts of your baby, as well as his or her sucking or crying, can stimulate the release of milk from your milk-producing glands. Benefits of breastfeeding Research shows that breastfeeding offers many health benefits for infants and mothers. It also offers a cost-free and convenient way to feed your baby. For your baby  Your first milk (colostrum) helps your baby's digestive system to function better.  Special cells in your milk (antibodies) help your baby to fight off infections.  Breastfed babies are less likely to develop asthma, allergies, obesity, or type 2 diabetes. They are also at lower risk for sudden infant death syndrome (SIDS).  Nutrients in breast milk are better able to meet your baby's needs compared to infant formula.  Breast milk improves your baby's brain development. For you  Breastfeeding helps to create a very special bond between you and your baby.  Breastfeeding is convenient. Breast milk costs nothing and is always available at the correct temperature.  Breastfeeding helps to burn calories. It helps you to lose the weight that you gained during pregnancy.  Breastfeeding makes your uterus return faster to its size before pregnancy. It also slows bleeding (lochia)  after you give birth.  Breastfeeding helps to lower your risk of developing type 2 diabetes, osteoporosis, rheumatoid arthritis, cardiovascular disease, and breast, ovarian, uterine, and endometrial cancer later in life. Breastfeeding basics Starting breastfeeding  Find a comfortable place to sit or lie down, with your neck and back well-supported.  Place a pillow or a rolled-up blanket under your baby to bring him or her to the level of your breast (if you are seated). Nursing pillows are specially designed to help support your arms and your baby while you breastfeed.  Make sure that your baby's tummy (abdomen) is facing your abdomen.  Gently massage your breast. With your fingertips, massage from the outer edges of your breast inward toward the nipple. This encourages milk flow. If your milk flows slowly, you may need to continue this action during the feeding.  Support your breast with 4 fingers underneath and your thumb above your nipple (make the letter "C" with your hand). Make sure your fingers are well away from your nipple and your baby's mouth.  Stroke your baby's lips gently with your finger or nipple.  When your baby's mouth is open wide enough, quickly bring your baby to your breast, placing your entire nipple and as much of the areola as possible into your baby's mouth. The areola is the colored area around your nipple. ? More areola should be visible above your baby's upper lip than below the lower lip. ? Your baby's lips should be opened and extended outward (flanged) to ensure an adequate, comfortable latch. ? Your baby's tongue should be between his or her lower gum and your breast.  Make sure that your baby's mouth is correctly positioned around your nipple (latched). Your baby's lips should create a seal on your breast and be turned out (everted).  It is common for your baby to suck about 2-3 minutes in order to start the flow of breast milk. Latching Teaching your baby  how to latch onto your breast properly is very important. An improper latch can cause nipple pain, decreased milk supply, and poor weight gain in your baby. Also, if your baby is not latched onto  your nipple properly, he or she may swallow some air during feeding. This can make your baby fussy. Burping your baby when you switch breasts during the feeding can help to get rid of the air. However, teaching your baby to latch on properly is still the best way to prevent fussiness from swallowing air while breastfeeding. Signs that your baby has successfully latched onto your nipple  Silent tugging or silent sucking, without causing you pain. Infant's lips should be extended outward (flanged).  Swallowing heard between every 3-4 sucks once your milk has started to flow (after your let-down milk reflex occurs).  Muscle movement above and in front of his or her ears while sucking. Signs that your baby has not successfully latched onto your nipple  Sucking sounds or smacking sounds from your baby while breastfeeding.  Nipple pain. If you think your baby has not latched on correctly, slip your finger into the corner of your baby's mouth to break the suction and place it between your baby's gums. Attempt to start breastfeeding again. Signs of successful breastfeeding Signs from your baby  Your baby will gradually decrease the number of sucks or will completely stop sucking.  Your baby will fall asleep.  Your baby's body will relax.  Your baby will retain a small amount of milk in his or her mouth.  Your baby will let go of your breast by himself or herself. Signs from you  Breasts that have increased in firmness, weight, and size 1-3 hours after feeding.  Breasts that are softer immediately after breastfeeding.  Increased milk volume, as well as a change in milk consistency and color by the fifth day of breastfeeding.  Nipples that are not sore, cracked, or bleeding. Signs that your baby is  getting enough milk  Wetting at least 1-2 diapers during the first 24 hours after birth.  Wetting at least 5-6 diapers every 24 hours for the first week after birth. The urine should be clear or pale yellow by the age of 5 days.  Wetting 6-8 diapers every 24 hours as your baby continues to grow and develop.  At least 3 stools in a 24-hour period by the age of 5 days. The stool should be soft and yellow.  At least 3 stools in a 24-hour period by the age of 7 days. The stool should be seedy and yellow.  No loss of weight greater than 10% of birth weight during the first 3 days of life.  Average weight gain of 4-7 oz (113-198 g) per week after the age of 4 days.  Consistent daily weight gain by the age of 5 days, without weight loss after the age of 2 weeks. After a feeding, your baby may spit up a small amount of milk. This is normal. Breastfeeding frequency and duration Frequent feeding will help you make more milk and can prevent sore nipples and extremely full breasts (breast engorgement). Breastfeed when you feel the need to reduce the fullness of your breasts or when your baby shows signs of hunger. This is called "breastfeeding on demand." Signs that your baby is hungry include:  Increased alertness, activity, or restlessness.  Movement of the head from side to side.  Opening of the mouth when the corner of the mouth or cheek is stroked (rooting).  Increased sucking sounds, smacking lips, cooing, sighing, or squeaking.  Hand-to-mouth movements and sucking on fingers or hands.  Fussing or crying. Avoid introducing a pacifier to your baby in the first 4-6 weeks  after your baby is born. After this time, you may choose to use a pacifier. Research has shown that pacifier use during the first year of a baby's life decreases the risk of sudden infant death syndrome (SIDS). Allow your baby to feed on each breast as long as he or she wants. When your baby unlatches or falls asleep while  feeding from the first breast, offer the second breast. Because newborns are often sleepy in the first few weeks of life, you may need to awaken your baby to get him or her to feed. Breastfeeding times will vary from baby to baby. However, the following rules can serve as a guide to help you make sure that your baby is properly fed:  Newborns (babies 10 weeks of age or younger) may breastfeed every 1-3 hours.  Newborns should not go without breastfeeding for longer than 3 hours during the day or 5 hours during the night.  You should breastfeed your baby a minimum of 8 times in a 24-hour period. Breast milk pumping     Pumping and storing breast milk allows you to make sure that your baby is exclusively fed your breast milk, even at times when you are unable to breastfeed. This is especially important if you go back to work while you are still breastfeeding, or if you are not able to be present during feedings. Your lactation consultant can help you find a method of pumping that works best for you and give you guidelines about how long it is safe to store breast milk. Caring for your breasts while you breastfeed Nipples can become dry, cracked, and sore while breastfeeding. The following recommendations can help keep your breasts moisturized and healthy:  Avoid using soap on your nipples.  Wear a supportive bra designed especially for nursing. Avoid wearing underwire-style bras or extremely tight bras (sports bras).  Air-dry your nipples for 3-4 minutes after each feeding.  Use only cotton bra pads to absorb leaked breast milk. Leaking of breast milk between feedings is normal.  Use lanolin on your nipples after breastfeeding. Lanolin helps to maintain your skin's normal moisture barrier. Pure lanolin is not harmful (not toxic) to your baby. You may also hand express a few drops of breast milk and gently massage that milk into your nipples and allow the milk to air-dry. In the first few weeks  after giving birth, some women experience breast engorgement. Engorgement can make your breasts feel heavy, warm, and tender to the touch. Engorgement peaks within 3-5 days after you give birth. The following recommendations can help to ease engorgement:  Completely empty your breasts while breastfeeding or pumping. You may want to start by applying warm, moist heat (in the shower or with warm, water-soaked hand towels) just before feeding or pumping. This increases circulation and helps the milk flow. If your baby does not completely empty your breasts while breastfeeding, pump any extra milk after he or she is finished.  Apply ice packs to your breasts immediately after breastfeeding or pumping, unless this is too uncomfortable for you. To do this: ? Put ice in a plastic bag. ? Place a towel between your skin and the bag. ? Leave the ice on for 20 minutes, 2-3 times a day.  Make sure that your baby is latched on and positioned properly while breastfeeding. If engorgement persists after 48 hours of following these recommendations, contact your health care provider or a Science writer. Overall health care recommendations while breastfeeding  Eat 3 healthy  meals and 3 snacks every day. Well-nourished mothers who are breastfeeding need an additional 450-500 calories a day. You can meet this requirement by increasing the amount of a balanced diet that you eat.  Drink enough water to keep your urine pale yellow or clear.  Rest often, relax, and continue to take your prenatal vitamins to prevent fatigue, stress, and low vitamin and mineral levels in your body (nutrient deficiencies).  Do not use any products that contain nicotine or tobacco, such as cigarettes and e-cigarettes. Your baby may be harmed by chemicals from cigarettes that pass into breast milk and exposure to secondhand smoke. If you need help quitting, ask your health care provider.  Avoid alcohol.  Do not use illegal drugs or  marijuana.  Talk with your health care provider before taking any medicines. These include over-the-counter and prescription medicines as well as vitamins and herbal supplements. Some medicines that may be harmful to your baby can pass through breast milk.  It is possible to become pregnant while breastfeeding. If birth control is desired, ask your health care provider about options that will be safe while breastfeeding your baby. Where to find more information: [employer]: www.llli.org Contact a health care provider if:  You feel like you want to stop breastfeeding or have become frustrated with breastfeeding.  Your nipples are cracked or bleeding.  Your breasts are red, tender, or warm.  You have: ? Painful breasts or nipples. ? A swollen area on either breast. ? A fever or chills. ? Nausea or vomiting. ? Drainage other than breast milk from your nipples.  Your breasts do not become full before feedings by the fifth day after you give birth.  You feel sad and depressed.  Your baby is: ? Too sleepy to eat well. ? Having trouble sleeping. ? More than 53 week old and wetting fewer than 6 diapers in a 24-hour period. ? Not gaining weight by 44 days of age.  Your baby has fewer than 3 stools in a 24-hour period.  Your baby's skin or the white parts of his or her eyes become yellow. Get help right away if:  Your baby is overly tired (lethargic) and does not want to wake up and feed.  Your baby develops an unexplained fever. Summary  Breastfeeding offers many health benefits for infant and mothers.  Try to breastfeed your infant when he or she shows early signs of hunger.  Gently tickle or stroke your baby's lips with your finger or nipple to allow the baby to open his or her mouth. Bring the baby to your breast. Make sure that much of the areola is in your baby's mouth. Offer one side and burp the baby before you offer the other side.  Talk with your  health care provider or lactation consultant if you have questions or you face problems as you breastfeed. This information is not intended to replace advice given to you by your health care provider. Make sure you discuss any questions you have with your health care provider. Document Released: 02/16/2005 Document Revised: 03/20/2016 Document Reviewed: 03/20/2016 Elsevier Interactive Patient Education  2019 [employer].

## 2018-05-01 LAB — URINE CULTURE

## 2018-05-02 ENCOUNTER — Other Ambulatory Visit: Payer: Self-pay | Admitting: Advanced Practice Midwife

## 2018-05-02 DIAGNOSIS — R8271 Bacteriuria: Secondary | ICD-10-CM

## 2018-05-02 LAB — RPR+RH+ABO+RUB AB+AB SCR+CB...
Antibody Screen: NEGATIVE
HEMOGLOBIN: 11 g/dL — AB (ref 11.1–15.9)
HEP B S AG: NEGATIVE
HIV Screen 4th Generation wRfx: NONREACTIVE
Hematocrit: 33.5 % — ABNORMAL LOW (ref 34.0–46.6)
MCH: 26.4 pg — ABNORMAL LOW (ref 26.6–33.0)
MCHC: 32.8 g/dL (ref 31.5–35.7)
MCV: 81 fL (ref 79–97)
PLATELETS: 318 10*3/uL (ref 150–450)
RBC: 4.16 x10E6/uL (ref 3.77–5.28)
RDW: 13 % (ref 11.7–15.4)
RPR Ser Ql: NONREACTIVE
Rh Factor: POSITIVE
Rubella Antibodies, IGG: 4.9 index (ref >0.99)
VARICELLA: 3298 {index} (ref >165)
WBC: 10.8 10*3/uL (ref 3.4–10.8)

## 2018-05-02 LAB — HEMOGLOBINOPATHY EVALUATION
HEMOGLOBIN F QUANTITATION: 0 % (ref 0.0–2.0)
HGB A: 97.9 % (ref 96.4–98.8)
HGB C: 0 %
HGB S: 0 %
HGB VARIANT: 0 %
Hemoglobin A2 Quantitation: 2.1 % (ref 1.8–3.2)

## 2018-05-02 LAB — CYTOLOGY - PAP
Chlamydia: NEGATIVE
Diagnosis: NEGATIVE
Neisseria Gonorrhea: NEGATIVE
TRICH (WINDOWPATH): NEGATIVE

## 2018-05-02 MED ORDER — AMPICILLIN 500 MG PO CAPS: 500.0000 mg | ORAL_CAPSULE | Freq: Four times a day (QID) | ORAL | 0 refills | Status: AC

## 2018-05-02 NOTE — Progress Notes (Signed)
Rx ampicillin sent to treat gbs bacteriuria

## 2018-05-05 ENCOUNTER — Ambulatory Visit (INDEPENDENT_AMBULATORY_CARE_PROVIDER_SITE_OTHER): Payer: Medicaid Other | Admitting: Obstetrics & Gynecology

## 2018-05-05 ENCOUNTER — Ambulatory Visit (INDEPENDENT_AMBULATORY_CARE_PROVIDER_SITE_OTHER): Payer: Medicaid Other

## 2018-05-05 VITALS — BP 120/80 | Wt 123.0 lb

## 2018-05-05 DIAGNOSIS — D251 Intramural leiomyoma of uterus: Secondary | ICD-10-CM | POA: Diagnosis not present

## 2018-05-05 DIAGNOSIS — O3411 Maternal care for benign tumor of corpus uteri, first trimester: Secondary | ICD-10-CM

## 2018-05-05 DIAGNOSIS — Z3A12 12 weeks gestation of pregnancy: Secondary | ICD-10-CM

## 2018-05-05 DIAGNOSIS — Z3480 Encounter for supervision of other normal pregnancy, unspecified trimester: Secondary | ICD-10-CM

## 2018-05-05 DIAGNOSIS — Z3481 Encounter for supervision of other normal pregnancy, first trimester: Secondary | ICD-10-CM

## 2018-05-05 LAB — POCT URINALYSIS DIPSTICK OB
GLUCOSE, UA: NEGATIVE
POC,PROTEIN,UA: NEGATIVE

## 2018-05-05 NOTE — Addendum Note (Signed)
Addended by: Quintella Baton D on: 05/05/2018 02:30 PM   Modules accepted: Orders

## 2018-05-05 NOTE — Progress Notes (Signed)
  HPI: Pt has had min nausea and no pain or bleeding.  Pt is 11 weeks by dates  Ultrasound demonstrates IUP, see below Also fibroid noted  PMHx: She  has a past medical history of Miscarriage and Seizure ([place]). Also,  has no past surgical history on file., family history includes Breast cancer (age of onset: 9) in her paternal grandmother; Lung cancer (age of onset: 6) in her maternal grandfather.,  reports that she has quit smoking. Her smoking use included cigarettes. She has never used smokeless tobacco. She reports current alcohol use. She reports that she does not use drugs.  She has a current medication list which includes the following prescription(s): ampicillin and multivitamin-prenatal. Also, is allergic to grape flavor [grape (artificial) flavor].  Review of Systems  All other systems reviewed and are negative.  Objective: BP 120/80   Wt 123 lb (55.8 kg)   LMP 02/17/2018 (Exact Date)   BMI 24.02 kg/m   Physical examination Constitutional NAD, Conversant  Skin No rashes, lesions or ulceration.   Extremities: Moves all appropriately.  Normal ROM for age. No lymphadenopathy.  Neuro: Grossly intact  Psych: Oriented to PPT.  Normal mood. Normal affect.   US Ob Comp Less 14 Wks  Result Date: 05/05/2018 ULTRASOUND REPORT Location: [facility] Date of Service: 05/05/2018 Patient Name: Diana Stuart DOB: 1990-10-15 MRN: [id number] Indications:dating Findings: Diana Stuart intrauterine pregnancy is visualized with a CRL consistent with [redacted]w[redacted]d gestation, giving an (U/S) EDD of 11/16/2018. The (U/S) EDD is not consistent with the clinically established EDD of 11/24/2018. FHR: 157 BPM CRL measurement: 56.4 mm Yolk sac is visualized and appears normal and early anatomy is normal. Amnion: visualized and appears normal Right Ovary is normal in appearance. Left Ovary is normal appearance. Corpus luteal cyst:  is not visualized Survey of the adnexa demonstrates no adnexal masses. There is no  free peritoneal fluid in the cul de sac. - Posterior intramural fibroid measures 70.87 x 38.09 x 40.91 mm Impression: 1. [redacted]w[redacted]d Viable Singleton Intrauterine pregnancy by U/S. 2. (U/S) EDD is 11/16/2018. 3.Posterior intramural fibroid measures 70.87 x 38.09 x 40.91 mm Recommendations: 1.Clinical correlation with the patient's History and Physical Exam. 2. Fibroid monitoring for growth and effect on pregnancy Diana Stuart Review of ULTRASOUND.    I have personally reviewed images and report of recent ultrasound done at [facility].    Plan of management to be discussed with patient. Barnett Applebaum, MD, [facility], [facility] 05/05/2018  2:17 PM   Assessment:  [redacted] weeks gestation of pregnancy Supervision of other normal pregnancy, antepartum Fibroid uterus  Declines genetic testing Fibroid risk factors PNV OK to travel (cruise planned April)  Barnett Applebaum, MD, [facility], [facility] 05/05/2018  2:19 PM

## 2018-05-05 NOTE — Patient Instructions (Signed)
DUE DATE 11/16/2018  Commonly Asked Questions During Pregnancy  Cats: A parasite can be excreted in cat feces.  To avoid exposure you need to have another person empty the little box.  If you must empty the litter box you will need to wear gloves.  Wash your hands after handling your cat.  This parasite can also be found in raw or undercooked meat so this should also be avoided.  Colds, Sore Throats, Flu: Please check your medication sheet to see what you can take for symptoms.  If your symptoms are unrelieved by these medications please call the office.  Dental Work: Most any dental work Investment banker, corporate recommends is permitted.  X-rays should only be taken during the first trimester if absolutely necessary.  Your abdomen should be shielded with a lead apron during all x-rays.  Please notify your provider prior to receiving any x-rays.  Novocaine is fine; gas is not recommended.  If your dentist requires a note from [place] prior to dental work please call the office and we will provide one for you.  Exercise: Exercise is an important part of staying healthy during your pregnancy.  You may continue most exercises you were accustomed to prior to pregnancy.  Later in your pregnancy you will most likely notice you have difficulty with activities requiring balance like riding a bicycle.  It is important that you listen to your body and avoid activities that put you at a higher risk of falling.  Adequate rest and staying well hydrated are a must!  If you have questions about the safety of specific activities ask your provider.    Exposure to Children with illness: Try to avoid obvious exposure; report any symptoms to [place] when noted,  If you have chicken pos, red measles or mumps, you should be immune to these diseases.   Please do not take any vaccines while pregnant unless you have checked with your OB provider.  Fetal Movement: After 28 weeks we recommend you do "kick counts" twice daily.  Lie or sit down in a calm  quiet environment and count your baby movements "kicks".  You should feel your baby at least 10 times per hour.  If you have not felt 10 kicks within the first hour get up, walk around and have something sweet to eat or drink then repeat for an additional hour.  If count remains less than 10 per hour notify your provider.  Fumigating: Follow your pest control agent's advice as to how long to stay out of your home.  Ventilate the area well before re-entering.  Hemorrhoids:   Most over-the-counter preparations can be used during pregnancy.  Check your medication to see what is safe to use.  It is important to use a stool softener or fiber in your diet and to drink lots of liquids.  If hemorrhoids seem to be getting worse please call the office.   Hot Tubs:  Hot tubs Jacuzzis and saunas are not recommended while pregnant.  These increase your internal body temperature and should be avoided.  Intercourse:  Sexual intercourse is safe during pregnancy as long as you are comfortable, unless otherwise advised by your provider.  Spotting may occur after intercourse; report any bright red bleeding that is heavier than spotting.  Labor:  If you know that you are in labor, please go to the hospital.  If you are unsure, please call the office and let us help you decide what to do.  Lifting, straining, etc:  If  your job requires heavy lifting or straining please check with your provider for any limitations.  Generally, you should not lift items heavier than that you can lift simply with your hands and arms (no back muscles)  Painting:  Paint fumes do not harm your pregnancy, but may make you ill and should be avoided if possible.  Latex or water based paints have less odor than oils.  Use adequate ventilation while painting.  Permanents & Hair Color:  Chemicals in hair dyes are not recommended as they cause increase hair dryness which can increase hair loss during pregnancy.  " Highlighting" and permanents are  allowed.  Dye may be absorbed differently and permanents may not hold as well during pregnancy.  Sunbathing:  Use a sunscreen, as skin burns easily during pregnancy.  Drink plenty of fluids; avoid over heating.  Tanning Beds:  Because their possible side effects are still unknown, tanning beds are not recommended.  Ultrasound Scans:  Routine ultrasounds are performed at approximately 20 weeks.  You will be able to see your baby's general anatomy an if you would like to know the gender this can usually be determined as well.  If it is questionable when you conceived you may also receive an ultrasound early in your pregnancy for dating purposes.  Otherwise ultrasound exams are not routinely performed unless there is a medical necessity.  Although you can request a scan we ask that you pay for it when conducted because insurance does not cover " patient request" scans.  Work: If your pregnancy proceeds without complications you may work until your due date, unless your physician or employer advises otherwise.  Round Ligament Pain/Pelvic Discomfort:  Sharp, shooting pains not associated with bleeding are fairly common, usually occurring in the second trimester of pregnancy.  They tend to be worse when standing up or when you remain standing for long periods of time.  These are the result of pressure of certain pelvic ligaments called "round ligaments".  Rest, Tylenol and heat seem to be the most effective relief.  As the womb and fetus grow, they rise out of the pelvis and the discomfort improves.  Please notify the office if your pain seems different than that described.  It may represent a more serious condition.

## 2018-05-27 ENCOUNTER — Other Ambulatory Visit: Payer: Self-pay | Admitting: Maternal Newborn

## 2018-05-27 DIAGNOSIS — Z3689 Encounter for other specified antenatal screening: Secondary | ICD-10-CM

## 2018-05-27 NOTE — Progress Notes (Signed)
Anatomy scan ordered for visit at 19-20 weeks

## 2018-06-02 ENCOUNTER — Encounter: Payer: Medicaid Other | Admitting: Maternal Newborn

## 2018-06-13 ENCOUNTER — Other Ambulatory Visit: Payer: Self-pay | Admitting: Obstetrics and Gynecology

## 2018-06-13 ENCOUNTER — Encounter: Payer: Self-pay | Admitting: Obstetrics & Gynecology

## 2018-06-13 NOTE — Telephone Encounter (Signed)
Pt calling; works at [employer]; 1 pt dx'd c Covid 60; 2 staff members showing sxs.  Pt is hoping to avoid all this.  Can we write her a note?  [phone]

## 2018-06-15 ENCOUNTER — Encounter: Payer: Self-pay | Admitting: Obstetrics & Gynecology

## 2018-06-15 NOTE — Telephone Encounter (Signed)
Find out where to fax note

## 2018-06-29 ENCOUNTER — Other Ambulatory Visit: Payer: Self-pay

## 2018-06-29 ENCOUNTER — Ambulatory Visit
Admission: RE | Admit: 2018-06-29 | Discharge: 2018-06-29 | Disposition: A | Discharge status: Home / Self Care | Payer: Medicaid Other | Source: Ambulatory Visit | Attending: Maternal Newborn | Admitting: Maternal Newborn

## 2018-06-29 DIAGNOSIS — Z3689 Encounter for other specified antenatal screening: Secondary | ICD-10-CM | POA: Diagnosis not present

## 2018-06-30 ENCOUNTER — Ambulatory Visit (INDEPENDENT_AMBULATORY_CARE_PROVIDER_SITE_OTHER): Payer: Medicaid Other | Admitting: Obstetrics and Gynecology

## 2018-06-30 ENCOUNTER — Other Ambulatory Visit: Payer: Medicaid Other

## 2018-06-30 ENCOUNTER — Encounter: Payer: Self-pay | Admitting: Obstetrics and Gynecology

## 2018-06-30 VITALS — BP 110/58 | Wt 131.0 lb

## 2018-06-30 DIAGNOSIS — Z3480 Encounter for supervision of other normal pregnancy, unspecified trimester: Secondary | ICD-10-CM

## 2018-06-30 DIAGNOSIS — Z3A20 20 weeks gestation of pregnancy: Secondary | ICD-10-CM

## 2018-06-30 DIAGNOSIS — Z3482 Encounter for supervision of other normal pregnancy, second trimester: Secondary | ICD-10-CM

## 2018-06-30 NOTE — Progress Notes (Signed)
ROB No concerns

## 2018-06-30 NOTE — Patient Instructions (Signed)
Hello,  Given the current COVID-19 pandemic, our practice is making changes in how we are providing care to our patients. We are limiting in-person visits for the safety of all of our patients.   As a practice, we have met to discuss the best way to minimize visits, but still provide excellent care to our expecting mothers.  We have decided on the following visit structure for low-risk pregnancies.  Initial Pregnancy visit will be conducted as a telephone or web visit.  Between 10-14 weeks  there will be one in-person visit for an ultrasound, lab work, and genetic screening. 20 weeks in-person visit with an anatomy ultrasound  24 weeks telephone visit 28 weeks in-person office visit for a 1-hour glucose test and a TDAP vaccination 32 weeks in-person office visit 34 weeks telephone visit 36 weeks in-person office visit for GBS, chlamydia, and gonorrhea testing 38 weeks in-person office visit 40 weeks in-person office visit  Understandably, some patients will require more visits than what is outlined above. Additional visits will be determined on a case-by-case basis.   We will, as always, be available for emergencies or to address concerns that might arise between in-person visits. We ask that you allow [place] the opportunity to address any concerns over the phone or through a virtual visit first. We will be available to return your phone calls throughout the day.   If you are able to purchase a scale, a blood pressure machine, and a home fetal doppler visits could be limited further. This will help decrease your exposure risks, but these purchases are not a necessity.   Things seem to change daily and there is the possibility that this structure could change, please be patient as we adapt to a new way of caring for patients.   Thank you for trusting [place] with your prenatal care. Our practice values you and looks forward to providing you with excellent care.   Sincerely,   [facility],  [place]    COVID-19 and Your Pregnancy FAQ  How can I prevent infection with COVID-19 during my pregnancy? Social distancing is key. Please limit any interactions in public. Try and work from home if possible. Frequently wash your hands after touching possibly contaminated surfaces. Avoid touching your face.  Minimize trips to the store. Consider online ordering when possible.   Should I wear a mask? YES. It is recommended by the CDC that all people wear a cloth mask or facial covering in public. You should wear a mask to your visits in the office. This will help reduce transmission as well as your risk or acquiring COVID-19. New studies are showing that even asymptomatic individuals can spread the virus from talking.   Where can I get a mask? [place] and the city of [place] are partnering to provide masks to community members. You can pick up a mask from several locations. This website also has instructions about how to make a mask by sewing or without sewing by using a t-shirt or bandana.  https://www.[place].gov/i-want-to/learn-about/covid-19-information-and-updates/covid-19-face-mask-project  Studies have shown that if you were a tube or nylon stocking from pantyhose over a cloth mask it makes the cloth mask almost as effective as a N95 mask.  https://www.davis-walter.com/  What are the symptoms of COVID-19? Fever (greater than 100.4 F), dry cough, shortness of breath.  Am I more at risk for COVID-19 since I am pregnant? There is not currently data showing that pregnant women are more adversely impacted by COVID-19 than the  general population. However, we know that pregnant women tend to have worse respiratory complications from similar diseases such as the flu and SARS and for this reason should be considered an at-risk population.  What do  I do if I am experiencing the symptoms of COVID-19? Testing is being limited because of test availability. If you are experiencing symptoms you should quarantine yourself, and the members of your family, for at least 2 weeks at home.   Please visit this website for more information: RunningShows.co.za.html  When should I go to the Emergency Room? Please go to the emergency room if you are experiencing ANY of these symptoms*:  1.    Difficulty breathing or shortness of breath 2.    Persistent pain or pressure in the chest 3.    Confusion or difficulty being aroused (or awakened) 4.    Bluish lips or face  *This list is not all inclusive. Please consult our office for any other symptoms that are severe or concerning.  What do I do if I am having difficulty breathing? You should go to the Emergency Room for evaluation. At this time they have a tent set up for evaluating patients with COVID-19 symptoms.   How will my prenatal care be different because of the COVID-19 pandemic? It has been recommended to reduce the frequency of face-to-face visits and use resources such as telephone and virtual visits when possible. Using a scale, blood pressure machine and fetal doppler at home can further help reduce face-to-face visits. You will be provided with additional information on this topic.  We ask that you come to your visits alone to minimize potential exposures to  COVID-19.  How can I receive childbirth education? At this time in-person classes have been cancelled. You can register for online childbirth education, breastfeeding, and newborn care classes.  Please visit:  CyberComps.hu for more information  How will my hospital birth experience be different? The hospital is currently limiting visitors. This means that while you are in labor you can only have one person at the hospital with you. Additional family members will not  be allowed to wait in the building or outside your room. Your one support person can be the father of the baby, a relative, a doula, or a friend. Once one support person is designated that person will wear a band. This band cannot be shared with multiple people.  Nitrous Gas is not being offered for pain relief since the tubing and filter for the machine can not be sanitized in a way to guarantee prevention of transmission of COVID-19.  Nasal cannula use of oxygen for fetal indications has also been discontinued.  Currently a clear plastic sheet is being hung between mom and the delivering provider during pushing and delivery to help prevent transmission of COVID-19.      How long will I stay in the hospital for after giving birth? It is also recommended that discharge home be expedited during the COVID-19 outbreak. This means staying for 1 day after a vaginal delivery and 2 days after a cesarean section. Patients who need to stay longer for medical reasons are allowed to do so, but the goal will be for expedited discharge home.   What if I have COVID-19 and I am in labor? We ask that you wear a mask while on labor and delivery. We will try and accommodate you being placed in a room that is capable of filtering the air. Please call ahead if you are in labor  and on your way to the hospital. The phone number for labor and delivery at [facility] is [phone].  If I have COVID-19 when my baby is born how can I prevent my baby from contracting COVID-19? This is an issue that will have to be discussed on a case-by-case basis. Current recommendations suggest providing separate isolation rooms for both the mother and new infant as well as limiting visitors. However, there are practical challenges to this recommendation. The situation will assuredly change and decisions will be influenced by the desires of the mother and availability of space.  Some suggestions are the use of a  curtain or physical barrier between mom and infant, hand hygiene, mom wearing a mask, or 6 feet of spacing between a mom and infant.   Can I breastfeed during the COVID-19 pandemic?   Yes, breastfeeding is encouraged.  Can I breastfeed if I have COVID-19? Yes. Covid-19 has not been found in breast milk. This means you cannot give COVID-19 to your child through breast milk. Breast feeding will also help pass antibodies to fight infection to your baby.   What precautions should I take when breastfeeding if I have COVID-19? If a mother and newborn do room-in and the mother wishes to feed at the breast, she should put on a facemask and practice hand hygiene before each feeding.  What precautions should I take when pumping if I have COVID-19? Prior to expressing breast milk, mothers should practice hand hygiene. After each pumping session, all parts that come into contact with breast milk should be thoroughly washed and the entire pump should be appropriately disinfected per the manufacturer's instructions. This expressed breast milk should be fed to the newborn by a healthy caregiver.  What if I am pregnant and work in healthcare? Based on limited data regarding COVID-19 and pregnancy, ACOG currently does not propose creating additional restrictions on pregnant health care personnel because of COVID-19 alone. Pregnant women do not appear to be at higher risk of severe disease related to COVID-19. Pregnant health care personnel should follow CDC risk assessment and infection control guidelines for health care personnel exposed to patients with suspected or confirmed COVID-19. Adherence to recommended infection prevention and control practices is an important part of protecting all health care personnel in health care settings.    Information on COVID-19 in pregnancy is very limited; however, facilities may want to consider limiting exposure of pregnant health care personnel to patients with confirmed or  suspected COVID-19 infection, especially during higher-risk procedures (eg, aerosol-generating procedures), if feasible, based on staffing availability.

## 2018-06-30 NOTE — Progress Notes (Signed)
    Routine Prenatal Care Visit  Subjective  Diana Stuart is a 28 y.o. G3P0020 at [redacted]w[redacted]d being seen today for ongoing prenatal care.  She is currently monitored for the following issues for this low-risk pregnancy and has Supervision of other normal pregnancy, antepartum on their problem list.  ----------------------------------------------------------------------------------- Patient reports no complaints.   Contractions: Not present. Vag. Bleeding: None.  Movement: Present. Denies leaking of fluid.  ----------------------------------------------------------------------------------- The following portions of the patient's history were reviewed and updated as appropriate: allergies, current medications, past family history, past medical history, past social history, past surgical history and problem list. Problem list updated.   Objective  Blood pressure (!) 110/58, weight 131 lb (59.4 kg), last menstrual period 02/17/2018. Pregravid weight 120 lb (54.4 kg) Total Weight Gain 11 lb (4.99 kg) Urinalysis:      Fetal Status: Fetal Heart Rate (bpm): 156   Movement: Present     General:  Alert, oriented and cooperative. Patient is in no acute distress.  Skin: Skin is warm and dry. No rash noted.   Cardiovascular: Normal heart rate noted  Respiratory: Normal respiratory effort, no problems with respiration noted  Abdomen: Soft, gravid, appropriate for gestational age. Pain/Pressure: Absent     Pelvic:  Cervical exam deferred        Extremities: Normal range of motion.  Edema: None  Mental Status: Normal mood and affect. Normal behavior. Normal judgment and thought content.     Assessment   28 y.o. G3P0020 at [redacted]w[redacted]d by  11/16/2018, by Ultrasound presenting for routine prenatal visit  Plan   pregnancy Problems (from 04/29/18 to present)    Problem Noted Resolved   Supervision of other normal pregnancy, antepartum 04/29/2018 by Rod Can, CNM No   Overview Addendum 06/30/2018   4:20 PM by Homero Fellers, [place] Prenatal Labs  Dating  12 wk [place] Blood type: A/Positive/-- (02/28 1136)   Genetic Screen declined screening Antibody:Negative (02/28 1136)  Anatomic [place]  complete, normal XY Rubella: 4.90 (02/28 1136)  Varicella: Immune  GTT Third trimester:  RPR: Non Reactive (02/28 1136)   Rhogam Not needed HBsAg: Negative (02/28 1136)   TDaP vaccine  Flu Shot: declined HIV: Non Reactive (02/28 1136)   Baby Food   Breast                             GBS:   Contraception  Pap: 04/29/18 NIL  CBB     CS/VBAC NA   Support Person Jose  Hx SAB x2, failed IUI             Gestational age appropriate obstetric precautions including but not limited to vaginal bleeding, contractions, leaking of fluid and fetal movement were reviewed in detail with the patient.    Anatomy US WNL Discussed fibroid and possibility of cesarean being needed if she doesn't progress as well as risk or hemorrhage.   Return in about 4 weeks (around 07/28/2018) for [place] telephone visit.  Homero Fellers MD [facility], [facility] 06/30/2018, 4:41 PM

## 2018-07-28 ENCOUNTER — Other Ambulatory Visit: Payer: Self-pay

## 2018-07-28 ENCOUNTER — Ambulatory Visit (INDEPENDENT_AMBULATORY_CARE_PROVIDER_SITE_OTHER): Payer: Medicaid Other | Admitting: Certified Nurse Midwife

## 2018-07-28 DIAGNOSIS — Z3A24 24 weeks gestation of pregnancy: Secondary | ICD-10-CM

## 2018-07-28 DIAGNOSIS — D259 Leiomyoma of uterus, unspecified: Secondary | ICD-10-CM

## 2018-07-28 DIAGNOSIS — Z130 Encounter for screening for diseases of the blood and blood-forming organs and certain disorders involving the immune mechanism: Secondary | ICD-10-CM

## 2018-07-28 DIAGNOSIS — Z113 Encounter for screening for infections with a predominantly sexual mode of transmission: Secondary | ICD-10-CM

## 2018-07-28 DIAGNOSIS — O3410 Maternal care for benign tumor of corpus uteri, unspecified trimester: Secondary | ICD-10-CM

## 2018-07-28 DIAGNOSIS — Z3480 Encounter for supervision of other normal pregnancy, unspecified trimester: Secondary | ICD-10-CM

## 2018-07-28 DIAGNOSIS — O3412 Maternal care for benign tumor of corpus uteri, second trimester: Secondary | ICD-10-CM | POA: Diagnosis not present

## 2018-07-28 DIAGNOSIS — Z131 Encounter for screening for diabetes mellitus: Secondary | ICD-10-CM

## 2018-07-28 NOTE — Progress Notes (Signed)
Routine Prenatal Care Visit- Virtual Visit  Subjective   Virtual Visit via Telephone Note  I connected with Terese Door on 07/28/18 at  9:30 AM EDT by telephone and verified that I am speaking with the correct person using two identifiers.   I discussed the limitations, risks, security and privacy concerns of performing an evaluation and management service by telephone and the availability of in person appointments. I also discussed with the patient that there may be a patient responsible charge related to this service. The patient expressed understanding and agreed to proceed.  The patient was at home I spoke with the patient from my  Work station [employer] of people involved in this encounter were: Bjorn Pippin , and [place] , and [place] .   Diana Stuart is a 28 y.o. G3P0020 at [redacted]w[redacted]d being seen today for ongoing prenatal care.  She is currently monitored for the following issues for this low-risk pregnancy and has Supervision of other normal pregnancy, antepartum and Uterine fibroid during pregnancy, antepartum on their problem list.  ----------------------------------------------------------------------------------- Patient reports some hip pain bilaterally. Occasionally vomits in Am upon arising, but able to keep down food the rest of the day. Occasional heartburn, but not as bad when she refrains from eating spicy, saucy foods. Baby moving well   Denies leaking of fluid or vaginal bleeding. 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 She works in a [employer] where there have been some concerns for Covid 19 infections. Is due to go back to work 21 June. Feeling uneasy about going back to work  ----------------------------------------------------------------------------------- The following portions of the patient's history were reviewed and updated as appropriate: allergies, current medications, past family history, past medical history, past social history,  past surgical history and problem list. Problem list updated.   Objective  Last menstrual period 02/17/2018. Pregravid weight 120 lb (54.4 kg) Total Weight Gain 11 lb (4.99 kg)  5.5 cm right posterior fibroid in LUS Physical Exam could not be performed. Because of the COVID-19 outbreak this visit was performed over the phone and not in person.   Assessment   28 y.o. G3P0020 at [redacted]w[redacted]d by  11/16/2018, by Ultrasound presenting for routine prenatal visit  Plan   pregnancy Problems (from 04/29/18 to present)    Problem Noted Resolved   Supervision of other normal pregnancy, antepartum 04/29/2018 by Rod Can, CNM No   Overview Addendum 06/30/2018  4:20 PM by Homero Fellers, [place] Prenatal Labs  Dating  12 wk [place] Blood type: A/Positive/-- (02/28 1136)   Genetic Screen declined screening Antibody:Negative (02/28 1136)  Anatomic [place]  complete, normal XY. 5.5 cm fibroid right posterior uterine segment Rubella: 4.90 (02/28 1136)  Varicella: Immune  GTT Third trimester:  RPR: Non Reactive (02/28 1136)   Rhogam Not needed HBsAg: Negative (02/28 1136)   TDaP vaccine  Flu Shot: declined HIV: Non Reactive (02/28 1136)   Baby Food   Breast                             GBS:   Contraception  Pap: 04/29/18 NIL  CBB     CS/VBAC NA   Support Person Jose  Hx SAB x2, failed IUI             Gestational age appropriate obstetric precautions including but not limited to vaginal bleeding, contractions, leaking of fluid and fetal  movement were reviewed in detail with the patient.     Follow Up Instructions: RTO in 3 weeks for ultrasound for growth of baby and follow up fibroid, 28 week labs and ROB Can discuss issue of going back to work at that visit    I discussed the assessment and treatment plan with the patient. The patient was provided an opportunity to ask questions and all were answered. The patient agreed with the plan and demonstrated an understanding of the  instructions.    I provided 15 minutes of non-face-to-face time during this encounter.   Dalia Heading, [facility], [facility] 07/28/2018 9:38 AM

## 2018-07-28 NOTE — Progress Notes (Signed)
ROB- no concerns 

## 2018-08-10 ENCOUNTER — Encounter: Payer: Self-pay | Admitting: Obstetrics & Gynecology

## 2018-08-10 NOTE — Telephone Encounter (Signed)
Out of work note is reasonable Done

## 2018-08-10 NOTE — Telephone Encounter (Signed)
Please advise 

## 2018-08-18 ENCOUNTER — Other Ambulatory Visit: Payer: Self-pay

## 2018-08-18 ENCOUNTER — Other Ambulatory Visit: Payer: Medicaid Other

## 2018-08-18 ENCOUNTER — Ambulatory Visit (INDEPENDENT_AMBULATORY_CARE_PROVIDER_SITE_OTHER): Payer: Medicaid Other

## 2018-08-18 ENCOUNTER — Other Ambulatory Visit: Payer: Self-pay | Admitting: Certified Nurse Midwife

## 2018-08-18 ENCOUNTER — Encounter: Payer: Self-pay | Admitting: Advanced Practice Midwife

## 2018-08-18 ENCOUNTER — Ambulatory Visit (INDEPENDENT_AMBULATORY_CARE_PROVIDER_SITE_OTHER): Payer: Medicaid Other | Admitting: Advanced Practice Midwife

## 2018-08-18 VITALS — BP 118/68 | Wt 139.0 lb

## 2018-08-18 DIAGNOSIS — Z113 Encounter for screening for infections with a predominantly sexual mode of transmission: Secondary | ICD-10-CM

## 2018-08-18 DIAGNOSIS — O3410 Maternal care for benign tumor of corpus uteri, unspecified trimester: Secondary | ICD-10-CM

## 2018-08-18 DIAGNOSIS — Z3A27 27 weeks gestation of pregnancy: Secondary | ICD-10-CM

## 2018-08-18 DIAGNOSIS — Z3482 Encounter for supervision of other normal pregnancy, second trimester: Secondary | ICD-10-CM

## 2018-08-18 DIAGNOSIS — O3412 Maternal care for benign tumor of corpus uteri, second trimester: Secondary | ICD-10-CM | POA: Diagnosis not present

## 2018-08-18 DIAGNOSIS — Z130 Encounter for screening for diseases of the blood and blood-forming organs and certain disorders involving the immune mechanism: Secondary | ICD-10-CM

## 2018-08-18 DIAGNOSIS — D259 Leiomyoma of uterus, unspecified: Secondary | ICD-10-CM

## 2018-08-18 DIAGNOSIS — Z3480 Encounter for supervision of other normal pregnancy, unspecified trimester: Secondary | ICD-10-CM

## 2018-08-18 DIAGNOSIS — Z131 Encounter for screening for diabetes mellitus: Secondary | ICD-10-CM

## 2018-08-18 LAB — POCT URINALYSIS DIPSTICK OB
Glucose, UA: NEGATIVE
POC,PROTEIN,UA: NEGATIVE

## 2018-08-18 NOTE — Progress Notes (Signed)
28 wk labs need to be r/s. Patient unable to complete entire glucola within timeframe.

## 2018-08-18 NOTE — Patient Instructions (Signed)
Third Trimester of Pregnancy The third trimester is from week 28 through week 40 (months 7 through 9). The third trimester is a time when the unborn baby (fetus) is growing rapidly. At the end of the ninth month, the fetus is about 20 inches in length and weighs 6-10 pounds. Body changes during your third trimester Your body will continue to go through many changes during pregnancy. The changes vary from woman to woman. During the third trimester:  Your weight will continue to increase. You can expect to gain 25-35 pounds (11-16 kg) by the end of the pregnancy.  You may begin to get stretch marks on your hips, abdomen, and breasts.  You may urinate more often because the fetus is moving lower into your pelvis and pressing on your bladder.  You may develop or continue to have heartburn. This is caused by increased hormones that slow down muscles in the digestive tract.  You may develop or continue to have constipation because increased hormones slow digestion and cause the muscles that push waste through your intestines to relax.  You may develop hemorrhoids. These are swollen veins (varicose veins) in the rectum that can itch or be painful.  You may develop swollen, bulging veins (varicose veins) in your legs.  You may have increased body aches in the pelvis, back, or thighs. This is due to weight gain and increased hormones that are relaxing your joints.  You may have changes in your hair. These can include thickening of your hair, rapid growth, and changes in texture. Some women also have hair loss during or after pregnancy, or hair that feels dry or thin. Your hair will most likely return to normal after your baby is born.  Your breasts will continue to grow and they will continue to become tender. A yellow fluid (colostrum) may leak from your breasts. This is the first milk you are producing for your baby.  Your belly button may stick out.  You may notice more swelling in your hands,  face, or ankles.  You may have increased tingling or numbness in your hands, arms, and legs. The skin on your belly may also feel numb.  You may feel short of breath because of your expanding uterus.  You may have more problems sleeping. This can be caused by the size of your belly, increased need to urinate, and an increase in your body's metabolism.  You may notice the fetus "dropping," or moving lower in your abdomen (lightening).  You may have increased vaginal discharge.  You may notice your joints feel loose and you may have pain around your pelvic bone. What to expect at prenatal visits You will have prenatal exams every 2 weeks until week 36. Then you will have weekly prenatal exams. During a routine prenatal visit:  You will be weighed to make sure you and the baby are growing normally.  Your blood pressure will be taken.  Your abdomen will be measured to track your baby's growth.  The fetal heartbeat will be listened to.  Any test results from the previous visit will be discussed.  You may have a cervical check near your due date to see if your cervix has softened or thinned (effaced).  You will be tested for Group B streptococcus. This happens between 35 and 37 weeks. Your health care provider may ask you:  What your birth plan is.  How you are feeling.  If you are feeling the baby move.  If you have had any abnormal   symptoms, such as leaking fluid, bleeding, severe headaches, or abdominal cramping.  If you are using any tobacco products, including cigarettes, chewing tobacco, and electronic cigarettes.  If you have any questions. Other tests or screenings that may be performed during your third trimester include:  Blood tests that check for low iron levels (anemia).  Fetal testing to check the health, activity level, and growth of the fetus. Testing is done if you have certain medical conditions or if there are problems during the pregnancy.  Nonstress test  (NST). This test checks the health of your baby to make sure there are no signs of problems, such as the baby not getting enough oxygen. During this test, a belt is placed around your belly. The baby is made to move, and its heart rate is monitored during movement. What is false labor? False labor is a condition in which you feel small, irregular tightenings of the muscles in the womb (contractions) that usually go away with rest, changing position, or drinking water. These are called Braxton Hicks contractions. Contractions may last for hours, days, or even weeks before true labor sets in. If contractions come at regular intervals, become more frequent, increase in intensity, or become painful, you should see your health care provider. What are the signs of labor?  Abdominal cramps.  Regular contractions that start at 10 minutes apart and become stronger and more frequent with time.  Contractions that start on the top of the uterus and spread down to the lower abdomen and back.  Increased pelvic pressure and dull back pain.  A watery or bloody mucus discharge that comes from the vagina.  Leaking of amniotic fluid. This is also known as your "water breaking." It could be a slow trickle or a gush. Let your health care provider know if it has a color or strange odor. If you have any of these signs, call your health care provider right away, even if it is before your due date. Follow these instructions at home: Medicines  Follow your health care provider's instructions regarding medicine use. Specific medicines may be either safe or unsafe to take during pregnancy.  Take a prenatal vitamin that contains at least 600 micrograms (mcg) of folic acid.  If you develop constipation, try taking a stool softener if your health care provider approves. Eating and drinking   Eat a balanced diet that includes fresh fruits and vegetables, whole grains, good sources of protein such as meat, eggs, or tofu,  and low-fat dairy. Your health care provider will help you determine the amount of weight gain that is right for you.  Avoid raw meat and uncooked cheese. These carry germs that can cause birth defects in the baby.  If you have low calcium intake from food, talk to your health care provider about whether you should take a daily calcium supplement.  Eat four or five small meals rather than three large meals a day.  Limit foods that are high in fat and processed sugars, such as fried and sweet foods.  To prevent constipation: ? Drink enough fluid to keep your urine clear or pale yellow. ? Eat foods that are high in fiber, such as fresh fruits and vegetables, whole grains, and beans. Activity  Exercise only as directed by your health care provider. Most women can continue their usual exercise routine during pregnancy. Try to exercise for 30 minutes at least 5 days a week. Stop exercising if you experience uterine contractions.  Avoid heavy lifting.  Do   not exercise in extreme heat or humidity, or at high altitudes.  Wear low-heel, comfortable shoes.  Practice good posture.  You may continue to have sex unless your health care provider tells you otherwise. Relieving pain and discomfort  Take frequent breaks and rest with your legs elevated if you have leg cramps or low back pain.  Take warm sitz baths to soothe any pain or discomfort caused by hemorrhoids. Use hemorrhoid cream if your health care provider approves.  Wear a good support bra to prevent discomfort from breast tenderness.  If you develop varicose veins: ? Wear support pantyhose or compression stockings as told by your healthcare provider. ? Elevate your feet for 15 minutes, 3-4 times a day. Prenatal care  Write down your questions. Take them to your prenatal visits.  Keep all your prenatal visits as told by your health care provider. This is important. Safety  Wear your seat belt at all times when driving.  Make  a list of emergency phone numbers, including numbers for family, friends, the hospital, and police and fire departments. General instructions  Avoid cat litter boxes and soil used by cats. These carry germs that can cause birth defects in the baby. If you have a cat, ask someone to clean the litter box for you.  Do not travel far distances unless it is absolutely necessary and only with the approval of your health care provider.  Do not use hot tubs, steam rooms, or saunas.  Do not drink alcohol.  Do not use any products that contain nicotine or tobacco, such as cigarettes and e-cigarettes. If you need help quitting, ask your health care provider.  Do not use any medicinal herbs or unprescribed drugs. These chemicals affect the formation and growth of the baby.  Do not douche or use tampons or scented sanitary pads.  Do not cross your legs for long periods of time.  To prepare for the arrival of your baby: ? Take prenatal classes to understand, practice, and ask questions about labor and delivery. ? Make a trial run to the hospital. ? Visit the hospital and tour the maternity area. ? Arrange for maternity or paternity leave through employers. ? Arrange for family and friends to take care of pets while you are in the hospital. ? Purchase a rear-facing car seat and make sure you know how to install it in your car. ? Pack your hospital bag. ? Prepare the baby's nursery. Make sure to remove all pillows and stuffed animals from the baby's crib to prevent suffocation.  Visit your dentist if you have not gone during your pregnancy. Use a soft toothbrush to brush your teeth and be gentle when you floss. Contact a health care provider if:  You are unsure if you are in labor or if your water has broken.  You become dizzy.  You have mild pelvic cramps, pelvic pressure, or nagging pain in your abdominal area.  You have lower back pain.  You have persistent nausea, vomiting, or  diarrhea.  You have an unusual or bad smelling vaginal discharge.  You have pain when you urinate. Get help right away if:  Your water breaks before 37 weeks.  You have regular contractions less than 5 minutes apart before 37 weeks.  You have a fever.  You are leaking fluid from your vagina.  You have spotting or bleeding from your vagina.  You have severe abdominal pain or cramping.  You have rapid weight loss or weight gain.  You have   shortness of breath with chest pain.  You notice sudden or extreme swelling of your face, hands, ankles, feet, or legs.  Your baby makes fewer than 10 movements in 2 hours.  You have severe headaches that do not go away when you take medicine.  You have vision changes. Summary  The third trimester is from week 28 through week 40, months 7 through 9. The third trimester is a time when the unborn baby (fetus) is growing rapidly.  During the third trimester, your discomfort may increase as you and your baby continue to gain weight. You may have abdominal, leg, and back pain, sleeping problems, and an increased need to urinate.  During the third trimester your breasts will keep growing and they will continue to become tender. A yellow fluid (colostrum) may leak from your breasts. This is the first milk you are producing for your baby.  False labor is a condition in which you feel small, irregular tightenings of the muscles in the womb (contractions) that eventually go away. These are called Braxton Hicks contractions. Contractions may last for hours, days, or even weeks before true labor sets in.  Signs of labor can include: abdominal cramps; regular contractions that start at 10 minutes apart and become stronger and more frequent with time; watery or bloody mucus discharge that comes from the vagina; increased pelvic pressure and dull back pain; and leaking of amniotic fluid. This information is not intended to replace advice given to you by your  health care provider. Make sure you discuss any questions you have with your health care provider. Document Released: 02/10/2001 Document Revised: 03/24/2016 Document Reviewed: 03/24/2016 Elsevier Interactive Patient Education  2019 [employer]  

## 2018-08-18 NOTE — Progress Notes (Signed)
Routine Prenatal Care Visit  Subjective  Diana Stuart is a 28 y.o. G3P0020 at [redacted]w[redacted]d being seen today for ongoing prenatal care.  She is currently monitored for the following issues for this low-risk pregnancy and has Supervision of other normal pregnancy, antepartum and Uterine fibroid during pregnancy, antepartum on their problem list.  ----------------------------------------------------------------------------------- Patient reports no complaints.  She was unable to complete the glucola and will reschedule soon for AM appointment. Discussed results of ultrasound. Contractions: Not present. Vag. Bleeding: None.  Movement: Present. Denies leaking of fluid.  ----------------------------------------------------------------------------------- The following portions of the patient's history were reviewed and updated as appropriate: allergies, current medications, past family history, past medical history, past social history, past surgical history and problem list. Problem list updated.   Objective  Blood pressure 118/68, weight 139 lb (63 kg), last menstrual period 02/17/2018. Pregravid weight 120 lb (54.4 kg) Total Weight Gain 19 lb (8.618 kg) Urinalysis: Urine Protein    Urine Glucose    Fetal Status: Fetal Heart Rate (bpm): 145 Fundal Height: 27 cm Movement: Present      Patient Name: Diana Stuart DOB: 11/08/1990 MRN: [id number] ULTRASOUND REPORT  Location: [facility] Date of Service: 08/18/2018   Indications: Growth Findings:  Diana Stuart intrauterine pregnancy is visualized with FHR at 153 BPM. Biometrics give an (U/S) Gestational age of [redacted]w[redacted]d and an (U/S) EDD of 11/29/18; this is 2 weeks behind the clinically established Estimated Date of Delivery: 11/16/18.  Fetal presentation is Cephalic.  Placenta: posterior. Grade: 1 AFI: Subjectively normal. Difficultly visualizing fibroid due to fetal head.   Growth percentile is 23.7. EFW: 810g (1Lb 16WF)  Umbilical  Artery Dopplers were performed due to [facility] only measuring 6%tile. Systolic and Diastolic blood flow in each umbilical artery appear normal and without reversal or absence of diastolic flow.  The maximum S/D ratio is 4.3 (95th %tile is 4.5).  According to [email or web address], this ratio is normal for this gestational age.  Impression: 1. [redacted]w[redacted]d Viable Singleton Intrauterine pregnancy previously established criteria. 2. Growth is 23.7 %ile. Biometrics measuring 2 weeks behind.  3. Normal UA doppler.   Recommendations: 1.Clinical correlation with the patient's History and Physical Exam.  Diana Stuart, RT  General:  Alert, oriented and cooperative. Patient is in no acute distress.  Skin: Skin is warm and dry. No rash noted.   Cardiovascular: Normal heart rate noted  Respiratory: Normal respiratory effort, no problems with respiration noted  Abdomen: Soft, gravid, appropriate for gestational age. Pain/Pressure: Absent     Pelvic:  Cervical exam deferred        Extremities: Normal range of motion.  Edema: None  Mental Status: Normal mood and affect. Normal behavior. Normal judgment and thought content.   Assessment   28 y.o. G3P0020 at [redacted]w[redacted]d by  11/16/2018, by Ultrasound presenting for routine prenatal visit  Plan   pregnancy Problems (from 04/29/18 to present)    Problem Noted Resolved   Supervision of other normal pregnancy, antepartum 04/29/2018 by Diana Stuart, CNM No   Overview Addendum 06/30/2018  4:20 PM by Diana Stuart, [place] Prenatal Labs  Dating  12 wk [place] Blood type: A/Positive/-- (02/28 1136)   Genetic Screen declined screening Antibody:Negative (02/28 1136)  Anatomic [place]  complete, normal XY Rubella: 4.90 (02/28 1136)  Varicella: Immune  GTT Third trimester:  RPR: Non Reactive (02/28 1136)   Rhogam Not needed HBsAg: Negative (02/28 1136)   TDaP vaccine  Flu Shot: declined HIV: Non Reactive (02/28  1136)   Baby Food   Breast                              GBS:   Contraception  Pap: 04/29/18 NIL  CBB     CS/VBAC NA   Support Person Diana Stuart  Hx SAB x2, failed IUI            Healthy calorie dense foods encouraged  Preterm labor symptoms and general obstetric precautions including but not limited to vaginal bleeding, contractions, leaking of fluid and fetal movement were reviewed in detail with the patient. Please refer to After Visit Summary for other counseling recommendations.   Return in about 1 week (around 08/25/2018) for reschedule 28 wk labs- lab only visit asap and rob in 2 weeks.  Diana Stuart, CNM 08/18/2018 3:32 PM

## 2018-08-18 NOTE — Addendum Note (Signed)
Addended by: Meryl Dare on: 08/18/2018 03:53 PM   Modules accepted: Orders

## 2018-08-25 ENCOUNTER — Other Ambulatory Visit: Payer: Self-pay

## 2018-08-25 ENCOUNTER — Other Ambulatory Visit: Payer: Medicaid Other

## 2018-08-26 LAB — 28 WEEK RH+PANEL
Basophils Absolute: 0.1 10*3/uL (ref 0.0–0.2)
Basos: 1 %
EOS (ABSOLUTE): 0.1 10*3/uL (ref 0.0–0.4)
Eos: 1 %
Gestational Diabetes Screen: 80 mg/dL (ref 65–139)
HIV Screen 4th Generation wRfx: NONREACTIVE
Hematocrit: 29.2 % — ABNORMAL LOW (ref 34.0–46.6)
Hemoglobin: 9.8 g/dL — ABNORMAL LOW (ref 11.1–15.9)
Immature Grans (Abs): 0 10*3/uL (ref 0.0–0.1)
Immature Granulocytes: 0 %
Lymphocytes Absolute: 2.2 10*3/uL (ref 0.7–3.1)
Lymphs: 22 %
MCH: 27.6 pg (ref 26.6–33.0)
MCHC: 33.6 g/dL (ref 31.5–35.7)
MCV: 82 fL (ref 79–97)
Monocytes Absolute: 1.1 10*3/uL — ABNORMAL HIGH (ref 0.1–0.9)
Monocytes: 11 %
Neutrophils Absolute: 6.5 10*3/uL (ref 1.4–7.0)
Neutrophils: 65 %
Platelets: 289 10*3/uL (ref 150–450)
RBC: 3.55 x10E6/uL — ABNORMAL LOW (ref 3.77–5.28)
RDW: 12.8 % (ref 11.7–15.4)
RPR Ser Ql: NONREACTIVE
WBC: 10 10*3/uL (ref 3.4–10.8)

## 2018-09-01 ENCOUNTER — Ambulatory Visit (INDEPENDENT_AMBULATORY_CARE_PROVIDER_SITE_OTHER): Payer: Medicaid Other | Admitting: Maternal Newborn

## 2018-09-01 ENCOUNTER — Other Ambulatory Visit: Payer: Self-pay

## 2018-09-01 ENCOUNTER — Encounter: Payer: Self-pay | Admitting: Maternal Newborn

## 2018-09-01 VITALS — BP 118/62 | Wt 141.0 lb

## 2018-09-01 DIAGNOSIS — Z3480 Encounter for supervision of other normal pregnancy, unspecified trimester: Secondary | ICD-10-CM

## 2018-09-01 DIAGNOSIS — Z23 Encounter for immunization: Secondary | ICD-10-CM | POA: Diagnosis not present

## 2018-09-01 DIAGNOSIS — Z3A29 29 weeks gestation of pregnancy: Secondary | ICD-10-CM

## 2018-09-01 DIAGNOSIS — Z3483 Encounter for supervision of other normal pregnancy, third trimester: Secondary | ICD-10-CM

## 2018-09-01 NOTE — Patient Instructions (Signed)
Third Trimester of Pregnancy The third trimester is from week 28 through week 40 (months 7 through 9). The third trimester is a time when the unborn baby (fetus) is growing rapidly. At the end of the ninth month, the fetus is about 20 inches in length and weighs 6-10 pounds. Body changes during your third trimester Your body will continue to go through many changes during pregnancy. The changes vary from woman to woman. During the third trimester:  Your weight will continue to increase. You can expect to gain 25-35 pounds (11-16 kg) by the end of the pregnancy.  You may begin to get stretch marks on your hips, abdomen, and breasts.  You may urinate more often because the fetus is moving lower into your pelvis and pressing on your bladder.  You may develop or continue to have heartburn. This is caused by increased hormones that slow down muscles in the digestive tract.  You may develop or continue to have constipation because increased hormones slow digestion and cause the muscles that push waste through your intestines to relax.  You may develop hemorrhoids. These are swollen veins (varicose veins) in the rectum that can itch or be painful.  You may develop swollen, bulging veins (varicose veins) in your legs.  You may have increased body aches in the pelvis, back, or thighs. This is due to weight gain and increased hormones that are relaxing your joints.  You may have changes in your hair. These can include thickening of your hair, rapid growth, and changes in texture. Some women also have hair loss during or after pregnancy, or hair that feels dry or thin. Your hair will most likely return to normal after your baby is born.  Your breasts will continue to grow and they will continue to become tender. A yellow fluid (colostrum) may leak from your breasts. This is the first milk you are producing for your baby.  Your belly button may stick out.  You may notice more swelling in your hands,  face, or ankles.  You may have increased tingling or numbness in your hands, arms, and legs. The skin on your belly may also feel numb.  You may feel short of breath because of your expanding uterus.  You may have more problems sleeping. This can be caused by the size of your belly, increased need to urinate, and an increase in your body's metabolism.  You may notice the fetus "dropping," or moving lower in your abdomen (lightening).  You may have increased vaginal discharge.  You may notice your joints feel loose and you may have pain around your pelvic bone. What to expect at prenatal visits You will have prenatal exams every 2 weeks until week 36. Then you will have weekly prenatal exams. During a routine prenatal visit:  You will be weighed to make sure you and the baby are growing normally.  Your blood pressure will be taken.  Your abdomen will be measured to track your baby's growth.  The fetal heartbeat will be listened to.  Any test results from the previous visit will be discussed.  You may have a cervical check near your due date to see if your cervix has softened or thinned (effaced).  You will be tested for Group B streptococcus. This happens between 35 and 37 weeks. Your health care provider may ask you:  What your birth plan is.  How you are feeling.  If you are feeling the baby move.  If you have had any abnormal   symptoms, such as leaking fluid, bleeding, severe headaches, or abdominal cramping.  If you are using any tobacco products, including cigarettes, chewing tobacco, and electronic cigarettes.  If you have any questions. Other tests or screenings that may be performed during your third trimester include:  Blood tests that check for low iron levels (anemia).  Fetal testing to check the health, activity level, and growth of the fetus. Testing is done if you have certain medical conditions or if there are problems during the pregnancy.  Nonstress test  (NST). This test checks the health of your baby to make sure there are no signs of problems, such as the baby not getting enough oxygen. During this test, a belt is placed around your belly. The baby is made to move, and its heart rate is monitored during movement. What is false labor? False labor is a condition in which you feel small, irregular tightenings of the muscles in the womb (contractions) that usually go away with rest, changing position, or drinking water. These are called Braxton Hicks contractions. Contractions may last for hours, days, or even weeks before true labor sets in. If contractions come at regular intervals, become more frequent, increase in intensity, or become painful, you should see your health care provider. What are the signs of labor?  Abdominal cramps.  Regular contractions that start at 10 minutes apart and become stronger and more frequent with time.  Contractions that start on the top of the uterus and spread down to the lower abdomen and back.  Increased pelvic pressure and dull back pain.  A watery or bloody mucus discharge that comes from the vagina.  Leaking of amniotic fluid. This is also known as your "water breaking." It could be a slow trickle or a gush. Let your health care provider know if it has a color or strange odor. If you have any of these signs, call your health care provider right away, even if it is before your due date. Follow these instructions at home: Medicines  Follow your health care provider's instructions regarding medicine use. Specific medicines may be either safe or unsafe to take during pregnancy.  Take a prenatal vitamin that contains at least 600 micrograms (mcg) of folic acid.  If you develop constipation, try taking a stool softener if your health care provider approves. Eating and drinking   Eat a balanced diet that includes fresh fruits and vegetables, whole grains, good sources of protein such as meat, eggs, or tofu,  and low-fat dairy. Your health care provider will help you determine the amount of weight gain that is right for you.  Avoid raw meat and uncooked cheese. These carry germs that can cause birth defects in the baby.  If you have low calcium intake from food, talk to your health care provider about whether you should take a daily calcium supplement.  Eat four or five small meals rather than three large meals a day.  Limit foods that are high in fat and processed sugars, such as fried and sweet foods.  To prevent constipation: ? Drink enough fluid to keep your urine clear or pale yellow. ? Eat foods that are high in fiber, such as fresh fruits and vegetables, whole grains, and beans. Activity  Exercise only as directed by your health care provider. Most women can continue their usual exercise routine during pregnancy. Try to exercise for 30 minutes at least 5 days a week. Stop exercising if you experience uterine contractions.  Avoid heavy lifting.  Do   not exercise in extreme heat or humidity, or at high altitudes.  Wear low-heel, comfortable shoes.  Practice good posture.  You may continue to have sex unless your health care provider tells you otherwise. Relieving pain and discomfort  Take frequent breaks and rest with your legs elevated if you have leg cramps or low back pain.  Take warm sitz baths to soothe any pain or discomfort caused by hemorrhoids. Use hemorrhoid cream if your health care provider approves.  Wear a good support bra to prevent discomfort from breast tenderness.  If you develop varicose veins: ? Wear support pantyhose or compression stockings as told by your healthcare provider. ? Elevate your feet for 15 minutes, 3-4 times a day. Prenatal care  Write down your questions. Take them to your prenatal visits.  Keep all your prenatal visits as told by your health care provider. This is important. Safety  Wear your seat belt at all times when driving.  Make  a list of emergency phone numbers, including numbers for family, friends, the hospital, and police and fire departments. General instructions  Avoid cat litter boxes and soil used by cats. These carry germs that can cause birth defects in the baby. If you have a cat, ask someone to clean the litter box for you.  Do not travel far distances unless it is absolutely necessary and only with the approval of your health care provider.  Do not use hot tubs, steam rooms, or saunas.  Do not drink alcohol.  Do not use any products that contain nicotine or tobacco, such as cigarettes and e-cigarettes. If you need help quitting, ask your health care provider.  Do not use any medicinal herbs or unprescribed drugs. These chemicals affect the formation and growth of the baby.  Do not douche or use tampons or scented sanitary pads.  Do not cross your legs for long periods of time.  To prepare for the arrival of your baby: ? Take prenatal classes to understand, practice, and ask questions about labor and delivery. ? Make a trial run to the hospital. ? Visit the hospital and tour the maternity area. ? Arrange for maternity or paternity leave through employers. ? Arrange for family and friends to take care of pets while you are in the hospital. ? Purchase a rear-facing car seat and make sure you know how to install it in your car. ? Pack your hospital bag. ? Prepare the baby's nursery. Make sure to remove all pillows and stuffed animals from the baby's crib to prevent suffocation.  Visit your dentist if you have not gone during your pregnancy. Use a soft toothbrush to brush your teeth and be gentle when you floss. Contact a health care provider if:  You are unsure if you are in labor or if your water has broken.  You become dizzy.  You have mild pelvic cramps, pelvic pressure, or nagging pain in your abdominal area.  You have lower back pain.  You have persistent nausea, vomiting, or diarrhea.   You have an unusual or bad smelling vaginal discharge.  You have pain when you urinate. Get help right away if:  Your water breaks before 37 weeks.  You have regular contractions less than 5 minutes apart before 37 weeks.  You have a fever.  You are leaking fluid from your vagina.  You have spotting or bleeding from your vagina.  You have severe abdominal pain or cramping.  You have rapid weight loss or weight gain.  You have  shortness of breath with chest pain.  You notice sudden or extreme swelling of your face, hands, ankles, feet, or legs.  Your baby makes fewer than 10 movements in 2 hours.  You have severe headaches that do not go away when you take medicine.  You have vision changes. Summary  The third trimester is from week 28 through week 40, months 7 through 9. The third trimester is a time when the unborn baby (fetus) is growing rapidly.  During the third trimester, your discomfort may increase as you and your baby continue to gain weight. You may have abdominal, leg, and back pain, sleeping problems, and an increased need to urinate.  During the third trimester your breasts will keep growing and they will continue to become tender. A yellow fluid (colostrum) may leak from your breasts. This is the first milk you are producing for your baby.  False labor is a condition in which you feel small, irregular tightenings of the muscles in the womb (contractions) that eventually go away. These are called Braxton Hicks contractions. Contractions may last for hours, days, or even weeks before true labor sets in.  Signs of labor can include: abdominal cramps; regular contractions that start at 10 minutes apart and become stronger and more frequent with time; watery or bloody mucus discharge that comes from the vagina; increased pelvic pressure and dull back pain; and leaking of amniotic fluid. This information is not intended to replace advice given to you by your health  care provider. Make sure you discuss any questions you have with your health care provider. Document Released: 02/10/2001 Document Revised: 06/09/2018 Document Reviewed: 03/24/2016 Elsevier Patient Education  2020 [employer].

## 2018-09-01 NOTE — Progress Notes (Signed)
ROB Tdap/BT consent today  C/o heartburn Denies lof, No vb, Good FM

## 2018-09-01 NOTE — Progress Notes (Signed)
    Routine Prenatal Care Visit  Subjective  Diana Stuart is a 28 y.o. G3P0020 at 101w1d being seen today for ongoing prenatal care.  She is currently monitored for the following issues for this low-risk pregnancy and has Supervision of other normal pregnancy, antepartum and Uterine fibroid during pregnancy, antepartum on their problem list.  ----------------------------------------------------------------------------------- Patient reports heartburn.  Usually happens when she eats certain foods. Tums no longer tolerable/helpful for symptoms. Contractions: Not present. Vag. Bleeding: None.  Movement: Present. No leaking of fluid.  ----------------------------------------------------------------------------------- The following portions of the patient's history were reviewed and updated as appropriate: allergies, current medications, past family history, past medical history, past social history, past surgical history and problem list. Problem list updated.  Objective  Blood pressure 118/62, weight 141 lb (64 kg), last menstrual period 02/17/2018. Pregravid weight 120 lb (54.4 kg) Total Weight Gain 21 lb (9.526 kg)  Fetal Status: Fetal Heart Rate (bpm): 149 Fundal Height: 30 cm Movement: Present     General:  Alert, oriented and cooperative. Patient is in no acute distress.  Skin: Skin is warm and dry. No rash noted.   Cardiovascular: Normal heart rate noted  Respiratory: Normal respiratory effort, no problems with respiration noted  Abdomen: Soft, gravid, appropriate for gestational age. Pain/Pressure: Absent     Pelvic:  Cervical exam deferred        Extremities: Normal range of motion.  Edema: None  Mental Status: Normal mood and affect. Normal behavior. Normal judgment and thought content.     Assessment   28 y.o. G3P0020 at [redacted]w[redacted]d, EDD 11/16/2018 by Ultrasound presenting for a work-in prenatal visit.  Plan   pregnancy Problems (from 04/29/18 to present)    Problem Noted  Resolved   Supervision of other normal pregnancy, antepartum 04/29/2018 by Rod Can, CNM No   Overview Addendum 06/30/2018  4:20 PM by Homero Fellers, [place] Prenatal Labs  Dating  12 wk [place] Blood type: A/Positive/-- (02/28 1136)   Genetic Screen declined screening Antibody:Negative (02/28 1136)  Anatomic [place]  complete, normal XY Rubella: 4.90 (02/28 1136)  Varicella: Immune  GTT Third trimester:  RPR: Non Reactive (02/28 1136)   Rhogam Not needed HBsAg: Negative (02/28 1136)   TDaP vaccine  Flu Shot: declined HIV: Non Reactive (02/28 1136)   Baby Food   Breast                             GBS:   Contraception  Pap: 04/29/18 NIL  CBB     CS/VBAC NA   Support Person Jose  Hx SAB x2, failed IUI          Recommended Maalox or Pepcid to help with heartburn/indigestion.  Discussed process of transferring care to a practice that delivers at [facility] or [facility] once she has decided on where she would like to give birth.  Please refer to After Visit Summary for other counseling recommendations.   Return in about 2 weeks (around 09/15/2018) for [place].  Avel Sensor, CNM 09/01/2018  4:15 PM

## 2018-09-07 ENCOUNTER — Telehealth: Payer: Self-pay | Admitting: Certified Nurse Midwife

## 2018-09-07 DIAGNOSIS — O99013 Anemia complicating pregnancy, third trimester: Secondary | ICD-10-CM

## 2018-09-07 DIAGNOSIS — O99019 Anemia complicating pregnancy, unspecified trimester: Secondary | ICD-10-CM | POA: Insufficient documentation

## 2018-09-07 MED ORDER — FERROUS SULFATE 325 (65 FE) MG PO TABS: 325.0000 mg | ORAL_TABLET | Freq: Every day | ORAL | 3 refills | Status: DC

## 2018-09-07 NOTE — Telephone Encounter (Signed)
CAlled and left message regarding mild anemia. Recommend taking iron supplements. Will call ferrous sulfate RX to pharmacy.

## 2018-09-15 ENCOUNTER — Other Ambulatory Visit: Payer: Self-pay

## 2018-09-15 ENCOUNTER — Ambulatory Visit (INDEPENDENT_AMBULATORY_CARE_PROVIDER_SITE_OTHER): Payer: Medicaid Other | Admitting: Advanced Practice Midwife

## 2018-09-15 ENCOUNTER — Encounter: Payer: Self-pay | Admitting: Advanced Practice Midwife

## 2018-09-15 VITALS — BP 116/78 | Wt 146.0 lb

## 2018-09-15 DIAGNOSIS — Z3A31 31 weeks gestation of pregnancy: Secondary | ICD-10-CM

## 2018-09-15 DIAGNOSIS — Z3480 Encounter for supervision of other normal pregnancy, unspecified trimester: Secondary | ICD-10-CM

## 2018-09-15 DIAGNOSIS — Z3483 Encounter for supervision of other normal pregnancy, third trimester: Secondary | ICD-10-CM

## 2018-09-15 NOTE — Progress Notes (Signed)
  Routine Prenatal Care Visit  Subjective  Diana Stuart is a 28 y.o. G3P0020 at [redacted]w[redacted]d being seen today for ongoing prenatal care.  She is currently monitored for the following issues for this low-risk pregnancy and has Supervision of other normal pregnancy, antepartum; Uterine fibroid during pregnancy, antepartum; and Anemia affecting pregnancy on their problem list.  ----------------------------------------------------------------------------------- Patient reports constipation.   Contractions: Not present. Vag. Bleeding: None.  Movement: Present. Denies leaking of fluid.  ----------------------------------------------------------------------------------- The following portions of the patient's history were reviewed and updated as appropriate: allergies, current medications, past family history, past medical history, past social history, past surgical history and problem list. Problem list updated.   Objective  Blood pressure 116/78, weight 146 lb (66.2 kg), last menstrual period 02/17/2018. Pregravid weight 120 lb (54.4 kg) Total Weight Gain 26 lb (11.8 kg) Urinalysis: Urine Protein    Urine Glucose    Fetal Status: Fetal Heart Rate (bpm): 143 Fundal Height: 32 cm Movement: Present     General:  Alert, oriented and cooperative. Patient is in no acute distress.  Skin: Skin is warm and dry. No rash noted.   Cardiovascular: Normal heart rate noted  Respiratory: Normal respiratory effort, no problems with respiration noted  Abdomen: Soft, gravid, appropriate for gestational age. Pain/Pressure: Absent     Pelvic:  Cervical exam deferred        Extremities: Normal range of motion.  Edema: None  Mental Status: Normal mood and affect. Normal behavior. Normal judgment and thought content.   Assessment   28 y.o. G3P0020 at [redacted]w[redacted]d by  11/16/2018, by Ultrasound presenting for routine prenatal visit  Plan   pregnancy Problems (from 04/29/18 to present)    Problem Noted Resolved   Supervision of other normal pregnancy, antepartum 04/29/2018 by Rod Can, CNM No   Overview Addendum 06/30/2018  4:20 PM by Homero Fellers, [place] Prenatal Labs  Dating  12 wk [place] Blood type: A/Positive/-- (02/28 1136)   Genetic Screen declined screening Antibody:Negative (02/28 1136)  Anatomic [place]  complete, normal XY Rubella: 4.90 (02/28 1136)  Varicella: Immune  GTT Third trimester:  RPR: Non Reactive (02/28 1136)   Rhogam Not needed HBsAg: Negative (02/28 1136)   TDaP vaccine  Flu Shot: declined HIV: Non Reactive (02/28 1136)   Baby Food   Breast                             GBS:   Contraception Non-hormonal Pap: 04/29/18 NIL  CBB     CS/VBAC NA   Support Person Jose  Hx SAB x2, failed IUI             Preterm labor symptoms and general obstetric precautions including but not limited to vaginal bleeding, contractions, leaking of fluid and fetal movement were reviewed in detail with the patient. Please refer to After Visit Summary for other counseling recommendations.   Increase hydration, fiber, activity Take fiber supplement as needed Safe OTC meds for constipation as needed  Return in about 2 weeks (around 09/29/2018) for rob.  Rod Can, CNM 09/15/2018 8:43 AM

## 2018-09-15 NOTE — Progress Notes (Signed)
No vb. No lof.  

## 2018-09-29 ENCOUNTER — Encounter: Payer: Medicaid Other | Admitting: Obstetrics and Gynecology

## 2018-09-29 ENCOUNTER — Telehealth: Payer: Self-pay

## 2018-09-29 ENCOUNTER — Other Ambulatory Visit: Payer: Self-pay | Admitting: Advanced Practice Midwife

## 2018-09-29 DIAGNOSIS — O219 Vomiting of pregnancy, unspecified: Secondary | ICD-10-CM

## 2018-09-29 MED ORDER — ONDANSETRON 4 MG PO TBDP: 4.0000 mg | ORAL_TABLET | Freq: Four times a day (QID) | ORAL | 0 refills | Status: DC | PRN

## 2018-09-29 NOTE — Telephone Encounter (Signed)
Rx zofran sent to her pharmacy. Can you remind her to keep trying to stay hydrated- small sips all day. Does she have a fever or diarrhea or any other signs of illness. If she is unable to keep ANYTHING down for more than 24 hours she may need to go to hospital for IV fluids.

## 2018-09-29 NOTE — Progress Notes (Unsigned)
Rx zofran sent to patient pharmacy.

## 2018-09-29 NOTE — Telephone Encounter (Signed)
Pt aware.

## 2018-09-29 NOTE — Telephone Encounter (Signed)
Pt calling triage c/o not being able to keep food/liquids down. Can she get RX for nausea?

## 2018-10-07 ENCOUNTER — Ambulatory Visit (INDEPENDENT_AMBULATORY_CARE_PROVIDER_SITE_OTHER): Payer: Medicaid Other | Admitting: Advanced Practice Midwife

## 2018-10-07 ENCOUNTER — Other Ambulatory Visit: Payer: Self-pay

## 2018-10-07 ENCOUNTER — Encounter: Payer: Self-pay | Admitting: Advanced Practice Midwife

## 2018-10-07 VITALS — BP 100/60 | Wt 150.6 lb

## 2018-10-07 DIAGNOSIS — Z3483 Encounter for supervision of other normal pregnancy, third trimester: Secondary | ICD-10-CM

## 2018-10-07 DIAGNOSIS — Z3A34 34 weeks gestation of pregnancy: Secondary | ICD-10-CM

## 2018-10-07 LAB — POCT URINALYSIS DIPSTICK OB
Glucose, UA: NEGATIVE
POC,PROTEIN,UA: NEGATIVE

## 2018-10-07 NOTE — Patient Instructions (Signed)
Braxton Hicks Contractions Contractions of the uterus can occur throughout pregnancy, but they are not always a sign that you are in labor. You may have practice contractions called Braxton Hicks contractions. These false labor contractions are sometimes confused with true labor. What are Braxton Hicks contractions? Braxton Hicks contractions are tightening movements that occur in the muscles of the uterus before labor. Unlike true labor contractions, these contractions do not result in opening (dilation) and thinning of the cervix. Toward the end of pregnancy (32-34 weeks), Braxton Hicks contractions can happen more often and may become stronger. These contractions are sometimes difficult to tell apart from true labor because they can be very uncomfortable. You should not feel embarrassed if you go to the hospital with false labor. Sometimes, the only way to tell if you are in true labor is for your health care provider to look for changes in the cervix. The health care provider will do a physical exam and may monitor your contractions. If you are not in true labor, the exam should show that your cervix is not dilating and your water has not broken. If there are no other health problems associated with your pregnancy, it is completely safe for you to be sent home with false labor. You may continue to have Braxton Hicks contractions until you go into true labor. How to tell the difference between true labor and false labor True labor  Contractions last 30-70 seconds.  Contractions become very regular.  Discomfort is usually felt in the top of the uterus, and it spreads to the lower abdomen and low back.  Contractions do not go away with walking.  Contractions usually become more intense and increase in frequency.  The cervix dilates and gets thinner. False labor  Contractions are usually shorter and not as strong as true labor contractions.  Contractions are usually irregular.  Contractions  are often felt in the front of the lower abdomen and in the groin.  Contractions may go away when you walk around or change positions while lying down.  Contractions get weaker and are shorter-lasting as time goes on.  The cervix usually does not dilate or become thin. Follow these instructions at home:   Take over-the-counter and prescription medicines only as told by your health care provider.  Keep up with your usual exercises and follow other instructions from your health care provider.  Eat and drink lightly if you think you are going into labor.  If Braxton Hicks contractions are making you uncomfortable: ? Change your position from lying down or resting to walking, or change from walking to resting. ? Sit and rest in a tub of warm water. ? Drink enough fluid to keep your urine pale yellow. Dehydration may cause these contractions. ? Do slow and deep breathing several times an hour.  Keep all follow-up prenatal visits as told by your health care provider. This is important. Contact a health care provider if:  You have a fever.  You have continuous pain in your abdomen. Get help right away if:  Your contractions become stronger, more regular, and closer together.  You have fluid leaking or gushing from your vagina.  You pass blood-tinged mucus (bloody show).  You have bleeding from your vagina.  You have low back pain that you never had before.  You feel your baby's head pushing down and causing pelvic pressure.  Your baby is not moving inside you as much as it used to. Summary  Contractions that occur before labor are   called Braxton Hicks contractions, false labor, or practice contractions.  Braxton Hicks contractions are usually shorter, weaker, farther apart, and less regular than true labor contractions. True labor contractions usually become progressively stronger and regular, and they become more frequent.  Manage discomfort from [employer] contractions  by changing position, resting in a warm bath, drinking plenty of water, or practicing deep breathing. This information is not intended to replace advice given to you by your health care provider. Make sure you discuss any questions you have with your health care provider. Document Released: 07/02/2016 Document Revised: 01/29/2017 Document Reviewed: 07/02/2016 Elsevier Patient Education  2020 [employer].

## 2018-10-07 NOTE — Progress Notes (Signed)
ROB- no concerns 

## 2018-10-07 NOTE — Progress Notes (Signed)
  Routine Prenatal Care Visit  Subjective  Diana Stuart is a 28 y.o. G3P0020 at [redacted]w[redacted]d being seen today for ongoing prenatal care.  She is currently monitored for the following issues for this low-risk pregnancy and has Supervision of other normal pregnancy, antepartum; Uterine fibroid during pregnancy, antepartum; and Anemia affecting pregnancy on their problem list.  ----------------------------------------------------------------------------------- Patient reports no complaints.   Contractions: Not present. Vag. Bleeding: None.  Movement: Present. Denies leaking of fluid.  ----------------------------------------------------------------------------------- The following portions of the patient's history were reviewed and updated as appropriate: allergies, current medications, past family history, past medical history, past social history, past surgical history and problem list. Problem list updated.   Objective  Blood pressure 100/60, weight 150 lb 9.6 oz (68.3 kg), last menstrual period 02/17/2018. Pregravid weight 120 lb (54.4 kg) Total Weight Gain 30 lb 9.6 oz (13.9 kg) Urinalysis: Urine Protein Negative  Urine Glucose Negative  Fetal Status: Fetal Heart Rate (bpm): 137 Fundal Height: 34 cm Movement: Present     General:  Alert, oriented and cooperative. Patient is in no acute distress.  Skin: Skin is warm and dry. No rash noted.   Cardiovascular: Normal heart rate noted  Respiratory: Normal respiratory effort, no problems with respiration noted  Abdomen: Soft, gravid, appropriate for gestational age. Pain/Pressure: Absent     Pelvic:  Cervical exam deferred        Extremities: Normal range of motion.     Mental Status: Normal mood and affect. Normal behavior. Normal judgment and thought content.   Assessment   28 y.o. G3P0020 at [redacted]w[redacted]d by  11/16/2018, by Ultrasound presenting for routine prenatal visit  Plan   pregnancy Problems (from 04/29/18 to present)    Problem Noted  Resolved   Supervision of other normal pregnancy, antepartum 04/29/2018 by Rod Can, CNM No   Overview Addendum 09/15/2018  8:45 AM by Rod Can, [place] Prenatal Labs  Dating  12 wk [place] Blood type: A/Positive/-- (02/28 1136)   Genetic Screen declined screening Antibody:Negative (02/28 1136)  Anatomic [place]  complete, normal XY Rubella: 4.90 (02/28 1136)  Varicella: Immune  GTT Third trimester:  RPR: Non Reactive (02/28 1136)   Rhogam Not needed HBsAg: Negative (02/28 1136)   TDaP vaccine  Flu Shot: declined HIV: Non Reactive (02/28 1136)   Baby Food   Breast                             GBS:   Contraception Non-hormonal Pap: 04/29/18 NIL  CBB     CS/VBAC NA   [place]  Hx SAB x2, failed IUI             Preterm labor symptoms and general obstetric precautions including but not limited to vaginal bleeding, contractions, leaking of fluid and fetal movement were reviewed in detail with the patient. Please refer to After Visit Summary for other counseling recommendations.   Return in about 2 weeks (around 10/21/2018) for rob.  Rod Can, CNM 10/07/2018 9:12 AM

## 2018-10-21 ENCOUNTER — Other Ambulatory Visit (HOSPITAL_COMMUNITY)
Admission: RE | Admit: 2018-10-21 | Discharge: 2018-10-21 | Disposition: A | Discharge status: Home / Self Care | Payer: Medicaid Other | Source: Ambulatory Visit | Attending: Maternal Newborn | Admitting: Maternal Newborn

## 2018-10-21 ENCOUNTER — Ambulatory Visit (INDEPENDENT_AMBULATORY_CARE_PROVIDER_SITE_OTHER): Payer: Medicaid Other | Admitting: Maternal Newborn

## 2018-10-21 ENCOUNTER — Other Ambulatory Visit: Payer: Self-pay

## 2018-10-21 VITALS — BP 120/70 | Wt 151.0 lb

## 2018-10-21 DIAGNOSIS — Z3480 Encounter for supervision of other normal pregnancy, unspecified trimester: Secondary | ICD-10-CM | POA: Diagnosis not present

## 2018-10-21 DIAGNOSIS — Z3A36 36 weeks gestation of pregnancy: Secondary | ICD-10-CM

## 2018-10-21 DIAGNOSIS — D259 Leiomyoma of uterus, unspecified: Secondary | ICD-10-CM

## 2018-10-21 DIAGNOSIS — Z369 Encounter for antenatal screening, unspecified: Secondary | ICD-10-CM

## 2018-10-21 DIAGNOSIS — O3413 Maternal care for benign tumor of corpus uteri, third trimester: Secondary | ICD-10-CM

## 2018-10-21 LAB — POCT URINALYSIS DIPSTICK OB: Glucose, UA: NEGATIVE

## 2018-10-21 NOTE — Patient Instructions (Signed)
Third Trimester of Pregnancy The third trimester is from week 28 through week 40 (months 7 through 9). The third trimester is a time when the unborn baby (fetus) is growing rapidly. At the end of the ninth month, the fetus is about 20 inches in length and weighs 6-10 pounds. Body changes during your third trimester Your body will continue to go through many changes during pregnancy. The changes vary from woman to woman. During the third trimester:  Your weight will continue to increase. You can expect to gain 25-35 pounds (11-16 kg) by the end of the pregnancy.  You may begin to get stretch marks on your hips, abdomen, and breasts.  You may urinate more often because the fetus is moving lower into your pelvis and pressing on your bladder.  You may develop or continue to have heartburn. This is caused by increased hormones that slow down muscles in the digestive tract.  You may develop or continue to have constipation because increased hormones slow digestion and cause the muscles that push waste through your intestines to relax.  You may develop hemorrhoids. These are swollen veins (varicose veins) in the rectum that can itch or be painful.  You may develop swollen, bulging veins (varicose veins) in your legs.  You may have increased body aches in the pelvis, back, or thighs. This is due to weight gain and increased hormones that are relaxing your joints.  You may have changes in your hair. These can include thickening of your hair, rapid growth, and changes in texture. Some women also have hair loss during or after pregnancy, or hair that feels dry or thin. Your hair will most likely return to normal after your baby is born.  Your breasts will continue to grow and they will continue to become tender. A yellow fluid (colostrum) may leak from your breasts. This is the first milk you are producing for your baby.  Your belly button may stick out.  You may notice more swelling in your hands,  face, or ankles.  You may have increased tingling or numbness in your hands, arms, and legs. The skin on your belly may also feel numb.  You may feel short of breath because of your expanding uterus.  You may have more problems sleeping. This can be caused by the size of your belly, increased need to urinate, and an increase in your body's metabolism.  You may notice the fetus "dropping," or moving lower in your abdomen (lightening).  You may have increased vaginal discharge.  You may notice your joints feel loose and you may have pain around your pelvic bone. What to expect at prenatal visits You will have prenatal exams every 2 weeks until week 36. Then you will have weekly prenatal exams. During a routine prenatal visit:  You will be weighed to make sure you and the baby are growing normally.  Your blood pressure will be taken.  Your abdomen will be measured to track your baby's growth.  The fetal heartbeat will be listened to.  Any test results from the previous visit will be discussed.  You may have a cervical check near your due date to see if your cervix has softened or thinned (effaced).  You will be tested for Group B streptococcus. This happens between 35 and 37 weeks. Your health care provider may ask you:  What your birth plan is.  How you are feeling.  If you are feeling the baby move.  If you have had any abnormal   symptoms, such as leaking fluid, bleeding, severe headaches, or abdominal cramping.  If you are using any tobacco products, including cigarettes, chewing tobacco, and electronic cigarettes.  If you have any questions. Other tests or screenings that may be performed during your third trimester include:  Blood tests that check for low iron levels (anemia).  Fetal testing to check the health, activity level, and growth of the fetus. Testing is done if you have certain medical conditions or if there are problems during the pregnancy.  Nonstress test  (NST). This test checks the health of your baby to make sure there are no signs of problems, such as the baby not getting enough oxygen. During this test, a belt is placed around your belly. The baby is made to move, and its heart rate is monitored during movement. What is false labor? False labor is a condition in which you feel small, irregular tightenings of the muscles in the womb (contractions) that usually go away with rest, changing position, or drinking water. These are called Braxton Hicks contractions. Contractions may last for hours, days, or even weeks before true labor sets in. If contractions come at regular intervals, become more frequent, increase in intensity, or become painful, you should see your health care provider. What are the signs of labor?  Abdominal cramps.  Regular contractions that start at 10 minutes apart and become stronger and more frequent with time.  Contractions that start on the top of the uterus and spread down to the lower abdomen and back.  Increased pelvic pressure and dull back pain.  A watery or bloody mucus discharge that comes from the vagina.  Leaking of amniotic fluid. This is also known as your "water breaking." It could be a slow trickle or a gush. Let your health care provider know if it has a color or strange odor. If you have any of these signs, call your health care provider right away, even if it is before your due date. Follow these instructions at home: Medicines  Follow your health care provider's instructions regarding medicine use. Specific medicines may be either safe or unsafe to take during pregnancy.  Take a prenatal vitamin that contains at least 600 micrograms (mcg) of folic acid.  If you develop constipation, try taking a stool softener if your health care provider approves. Eating and drinking   Eat a balanced diet that includes fresh fruits and vegetables, whole grains, good sources of protein such as meat, eggs, or tofu,  and low-fat dairy. Your health care provider will help you determine the amount of weight gain that is right for you.  Avoid raw meat and uncooked cheese. These carry germs that can cause birth defects in the baby.  If you have low calcium intake from food, talk to your health care provider about whether you should take a daily calcium supplement.  Eat four or five small meals rather than three large meals a day.  Limit foods that are high in fat and processed sugars, such as fried and sweet foods.  To prevent constipation: ? Drink enough fluid to keep your urine clear or pale yellow. ? Eat foods that are high in fiber, such as fresh fruits and vegetables, whole grains, and beans. Activity  Exercise only as directed by your health care provider. Most women can continue their usual exercise routine during pregnancy. Try to exercise for 30 minutes at least 5 days a week. Stop exercising if you experience uterine contractions.  Avoid heavy lifting.  Do   not exercise in extreme heat or humidity, or at high altitudes.  Wear low-heel, comfortable shoes.  Practice good posture.  You may continue to have sex unless your health care provider tells you otherwise. Relieving pain and discomfort  Take frequent breaks and rest with your legs elevated if you have leg cramps or low back pain.  Take warm sitz baths to soothe any pain or discomfort caused by hemorrhoids. Use hemorrhoid cream if your health care provider approves.  Wear a good support bra to prevent discomfort from breast tenderness.  If you develop varicose veins: ? Wear support pantyhose or compression stockings as told by your healthcare provider. ? Elevate your feet for 15 minutes, 3-4 times a day. Prenatal care  Write down your questions. Take them to your prenatal visits.  Keep all your prenatal visits as told by your health care provider. This is important. Safety  Wear your seat belt at all times when driving.  Make  a list of emergency phone numbers, including numbers for family, friends, the hospital, and police and fire departments. General instructions  Avoid cat litter boxes and soil used by cats. These carry germs that can cause birth defects in the baby. If you have a cat, ask someone to clean the litter box for you.  Do not travel far distances unless it is absolutely necessary and only with the approval of your health care provider.  Do not use hot tubs, steam rooms, or saunas.  Do not drink alcohol.  Do not use any products that contain nicotine or tobacco, such as cigarettes and e-cigarettes. If you need help quitting, ask your health care provider.  Do not use any medicinal herbs or unprescribed drugs. These chemicals affect the formation and growth of the baby.  Do not douche or use tampons or scented sanitary pads.  Do not cross your legs for long periods of time.  To prepare for the arrival of your baby: ? Take prenatal classes to understand, practice, and ask questions about labor and delivery. ? Make a trial run to the hospital. ? Visit the hospital and tour the maternity area. ? Arrange for maternity or paternity leave through employers. ? Arrange for family and friends to take care of pets while you are in the hospital. ? Purchase a rear-facing car seat and make sure you know how to install it in your car. ? Pack your hospital bag. ? Prepare the baby's nursery. Make sure to remove all pillows and stuffed animals from the baby's crib to prevent suffocation.  Visit your dentist if you have not gone during your pregnancy. Use a soft toothbrush to brush your teeth and be gentle when you floss. Contact a health care provider if:  You are unsure if you are in labor or if your water has broken.  You become dizzy.  You have mild pelvic cramps, pelvic pressure, or nagging pain in your abdominal area.  You have lower back pain.  You have persistent nausea, vomiting, or diarrhea.   You have an unusual or bad smelling vaginal discharge.  You have pain when you urinate. Get help right away if:  Your water breaks before 37 weeks.  You have regular contractions less than 5 minutes apart before 37 weeks.  You have a fever.  You are leaking fluid from your vagina.  You have spotting or bleeding from your vagina.  You have severe abdominal pain or cramping.  You have rapid weight loss or weight gain.  You have  shortness of breath with chest pain.  You notice sudden or extreme swelling of your face, hands, ankles, feet, or legs.  Your baby makes fewer than 10 movements in 2 hours.  You have severe headaches that do not go away when you take medicine.  You have vision changes. Summary  The third trimester is from week 28 through week 40, months 7 through 9. The third trimester is a time when the unborn baby (fetus) is growing rapidly.  During the third trimester, your discomfort may increase as you and your baby continue to gain weight. You may have abdominal, leg, and back pain, sleeping problems, and an increased need to urinate.  During the third trimester your breasts will keep growing and they will continue to become tender. A yellow fluid (colostrum) may leak from your breasts. This is the first milk you are producing for your baby.  False labor is a condition in which you feel small, irregular tightenings of the muscles in the womb (contractions) that eventually go away. These are called Braxton Hicks contractions. Contractions may last for hours, days, or even weeks before true labor sets in.  Signs of labor can include: abdominal cramps; regular contractions that start at 10 minutes apart and become stronger and more frequent with time; watery or bloody mucus discharge that comes from the vagina; increased pelvic pressure and dull back pain; and leaking of amniotic fluid. This information is not intended to replace advice given to you by your health  care provider. Make sure you discuss any questions you have with your health care provider. Document Released: 02/10/2001 Document Revised: 06/09/2018 Document Reviewed: 03/24/2016 Elsevier Patient Education  2020 [employer]  

## 2018-10-21 NOTE — Progress Notes (Signed)
    Routine Prenatal Care Visit  Subjective  Diana Stuart is a 28 y.o. G3P0020 at [redacted]w[redacted]d being seen today for ongoing prenatal care.  She is currently monitored for the following issues for this low-risk pregnancy and has Supervision of other normal pregnancy, antepartum; Uterine fibroid during pregnancy, antepartum; and Anemia affecting pregnancy on their problem list.  ----------------------------------------------------------------------------------- Patient reports some nausea and vomiting; tried Zofran but it did not help.  Contractions: Not present. Vag. Bleeding: None.  Movement: Present. No leaking of fluid.  ----------------------------------------------------------------------------------- The following portions of the patient's history were reviewed and updated as appropriate: allergies, current medications, past family history, past medical history, past social history, past surgical history and problem list. Problem list updated.   Objective  Blood pressure 120/70, weight 151 lb (68.5 kg), last menstrual period 02/17/2018. Pregravid weight 120 lb (54.4 kg) Total Weight Gain 31 lb (14.1 kg) Urinalysis: Urine dipstick shows negative for glucose, positive for protein (trace).  Fetal Status: Fetal Heart Rate (bpm): 139 Fundal Height: 36 cm Movement: Present     General:  Alert, oriented and cooperative. Patient is in no acute distress.  Skin: Skin is warm and dry. No rash noted.   Cardiovascular: Normal heart rate noted  Respiratory: Normal respiratory effort, no problems with respiration noted  Abdomen: Soft, gravid, appropriate for gestational age. Pain/Pressure: Absent     Pelvic:  Cervical exam performed Dilation: Closed Effacement (%): Thick Station: Ballotable  Extremities: Normal range of motion.  Edema: None  Mental Status: Normal mood and affect. Normal behavior. Normal judgment and thought content.     Assessment   28 y.o. G3P0020 at [redacted]w[redacted]d, EDD 11/16/2018 by  Ultrasound presenting for a routine prenatal visit.  Plan   pregnancy Problems (from 04/29/18 to present)    Problem Noted Resolved   Supervision of other normal pregnancy, antepartum 04/29/2018 by Rod Can, CNM No   Overview Addendum 09/15/2018  8:45 AM by Rod Can, [place] Prenatal Labs  Dating  12 wk [place] Blood type: A/Positive/-- (02/28 1136)   Genetic Screen declined screening Antibody:Negative (02/28 1136)  Anatomic [place]  complete, normal XY Rubella: 4.90 (02/28 1136)  Varicella: Immune  GTT Third trimester:  RPR: Non Reactive (02/28 1136)   Rhogam Not needed HBsAg: Negative (02/28 1136)   TDaP vaccine  Flu Shot: declined HIV: Non Reactive (02/28 1136)   Baby Food   Breast                             GBS:   Contraception Non-hormonal Pap: 04/29/18 NIL  CBB     CS/VBAC NA   [place]  Hx SAB x2, failed IUI          Offered different nausea medication, she declines for now and is coping by choosing more tolerable food/drink selections and with meal timing.  GBS and Aptima today.  Please refer to After Visit Summary for other counseling recommendations.   Return in about 1 week (around 10/28/2018) for [place].  Avel Sensor, CNM 10/21/2018  8:57 AM

## 2018-10-21 NOTE — Progress Notes (Signed)
ROB/GBS- nausea/vomiting med not working

## 2018-10-21 NOTE — Addendum Note (Signed)
Addended by: Avel Sensor on: 10/21/2018 09:01 AM   Modules accepted: Orders

## 2018-10-23 LAB — STREP GP B NAA: Strep Gp B NAA: NEGATIVE

## 2018-10-25 LAB — CERVICOVAGINAL ANCILLARY ONLY
Chlamydia: NEGATIVE
Neisseria Gonorrhea: NEGATIVE

## 2018-10-28 ENCOUNTER — Other Ambulatory Visit: Payer: Self-pay | Admitting: Maternal Newborn

## 2018-10-28 ENCOUNTER — Encounter: Payer: Self-pay | Admitting: Obstetrics and Gynecology

## 2018-10-28 ENCOUNTER — Ambulatory Visit (INDEPENDENT_AMBULATORY_CARE_PROVIDER_SITE_OTHER): Payer: Medicaid Other

## 2018-10-28 ENCOUNTER — Ambulatory Visit (INDEPENDENT_AMBULATORY_CARE_PROVIDER_SITE_OTHER): Payer: Medicaid Other | Admitting: Obstetrics and Gynecology

## 2018-10-28 ENCOUNTER — Other Ambulatory Visit: Payer: Self-pay | Admitting: Obstetrics and Gynecology

## 2018-10-28 ENCOUNTER — Other Ambulatory Visit: Payer: Self-pay

## 2018-10-28 VITALS — BP 128/76 | Wt 155.0 lb

## 2018-10-28 DIAGNOSIS — D259 Leiomyoma of uterus, unspecified: Secondary | ICD-10-CM

## 2018-10-28 DIAGNOSIS — Z369 Encounter for antenatal screening, unspecified: Secondary | ICD-10-CM

## 2018-10-28 DIAGNOSIS — O3413 Maternal care for benign tumor of corpus uteri, third trimester: Secondary | ICD-10-CM

## 2018-10-28 DIAGNOSIS — Z3A37 37 weeks gestation of pregnancy: Secondary | ICD-10-CM

## 2018-10-28 DIAGNOSIS — O365930 Maternal care for other known or suspected poor fetal growth, third trimester, not applicable or unspecified: Secondary | ICD-10-CM

## 2018-10-28 DIAGNOSIS — D252 Subserosal leiomyoma of uterus: Secondary | ICD-10-CM | POA: Diagnosis not present

## 2018-10-28 DIAGNOSIS — Z3480 Encounter for supervision of other normal pregnancy, unspecified trimester: Secondary | ICD-10-CM

## 2018-10-28 DIAGNOSIS — O3410 Maternal care for benign tumor of corpus uteri, unspecified trimester: Secondary | ICD-10-CM

## 2018-10-28 DIAGNOSIS — O99013 Anemia complicating pregnancy, third trimester: Secondary | ICD-10-CM

## 2018-10-28 LAB — POCT URINALYSIS DIPSTICK OB
Glucose, UA: NEGATIVE
POC,PROTEIN,UA: NEGATIVE

## 2018-10-28 NOTE — Progress Notes (Signed)
Routine Prenatal Care Visit  Subjective  Diana Stuart is a 28 y.o. G3P0020 at [redacted]w[redacted]d being seen today for ongoing prenatal care.  She is currently monitored for the following issues for this high-risk pregnancy and has Supervision of other normal pregnancy, antepartum; Uterine fibroid during pregnancy, antepartum; and Anemia affecting pregnancy on their problem list.  ----------------------------------------------------------------------------------- Patient reports no complaints.   Contractions: Not present. Vag. Bleeding: None.  Movement: Present. Leaking Fluid denies.  Growth u/s today: 5th %ile with normal afi and normal UA dopplers ----------------------------------------------------------------------------------- The following portions of the patient's history were reviewed and updated as appropriate: allergies, current medications, past family history, past medical history, past social history, past surgical history and problem list. Problem list updated.  Objective  Blood pressure 128/76, weight 155 lb (70.3 kg), last menstrual period 02/17/2018. Pregravid weight 120 lb (54.4 kg) Total Weight Gain 35 lb (15.9 kg) Urinalysis: Urine Protein Negative  Urine Glucose Negative  Fetal Status: Fetal Heart Rate (bpm): 145   Movement: Present  Presentation: Vertex  General:  Alert, oriented and cooperative. Patient is in no acute distress.  Skin: Skin is warm and dry. No rash noted.   Cardiovascular: Normal heart rate noted  Respiratory: Normal respiratory effort, no problems with respiration noted  Abdomen: Soft, gravid, appropriate for gestational age. Pain/Pressure: Absent     Pelvic:  Cervical exam deferred        Extremities: Normal range of motion.  Edema: None  Mental Status: Normal mood and affect. Normal behavior. Normal judgment and thought content.   NST Baseline FHR: 145 beats/min Variability: moderate Accelerations: present Decelerations: absent Tocometry: not  done  Interpretation:  INDICATIONS: intrauterine growth retardation RESULTS:  A NST procedure was performed with FHR monitoring and a normal baseline established, appropriate time of 20-40 minutes of evaluation, and accels >2 seen w 15x15 characteristics.  Results show a REACTIVE NST.    Imaging Results US Ob Follow Up  Result Date: 10/28/2018 Patient Name: Diana Stuart DOB: [birth year] MRN: [id number] ULTRASOUND REPORT Location: [facility] Date of Service: 10/28/2018 Indications:growth/afi and umbilical artery dopplers Findings: [name] intrauterine pregnancy is visualized with FHR at 141 BPM. Biometrics give an (U/S) Gestational age of [redacted]w[redacted]d and an (U/S) EDD of 12/07/2018; this correlates with the clinically established Estimated Date of Delivery: 11/16/2018.  Fetal presentation is Cephalic. Placenta: posterior. Grade: 3 AFI: 11.9 cm Growth percentile is less than 10th percentile. EFW: 2,445 g  (5 lb 6 oz) Umbilical artery dopplers were 1.79, 2.23, 3.02, 2.53 There is a subserosal fibroid right lateral uterus measuring 57 x 36 x 47 mm Impression: 1. [redacted]w[redacted]d Viable [place] Intrauterine pregnancy previously established criteria. 2. Growth is less than 10 %ile.  AFI is 11.9 cm.  Fetal growth restriction. 3. Average UA dopplers were 2.4 S/D ratio (normal for gestational age)  (50th %ile = 2.34, 95th %ile = 3.34 per [email or web address]) Recommendations: 1.Clinical correlation with the patient's History and Physical Exam. 2. Delivery timing per ACOG without complicating factors would be [redacted]w[redacted]d - [redacted]w[redacted]d with reassuring antenatal testing. Gweneth Dimitri, RT The ultrasound images and findings were reviewed by me and I agree with the above report. Prentice Docker, MD, [facility], [facility] 10/28/2018 10:14 AM       Assessment   28 y.o. G3P0020 at [redacted]w[redacted]d by  11/16/2018, by Ultrasound presenting for routine prenatal visit  Plan   pregnancy Problems (from 04/29/18 to present)     Problem Noted Resolved   Supervision of  other normal pregnancy, antepartum 04/29/2018 by Rod Can, CNM No   Overview Addendum 09/15/2018  8:45 AM by Rod Can, [place] Prenatal Labs  Dating  12 wk [place] Blood type: A/Positive/-- (02/28 1136)   Genetic Screen declined screening Antibody:Negative (02/28 1136)  Anatomic [place]  complete, normal XY Rubella: 4.90 (02/28 1136)  Varicella: Immune  GTT Third trimester:  RPR: Non Reactive (02/28 1136)   Rhogam Not needed HBsAg: Negative (02/28 1136)   TDaP vaccine  Flu Shot: declined HIV: Non Reactive (02/28 1136)   Baby Food   Breast                             GBS:   Contraception Non-hormonal Pap: 04/29/18 NIL  CBB     CS/VBAC NA   Support Person [place]  Hx SAB x2, failed IUI             Term labor symptoms and general obstetric precautions including but not limited to vaginal bleeding, contractions, leaking of fluid and fetal movement were reviewed in detail with the patient. Please refer to After Visit Summary for other counseling recommendations.   - Discussed finding of FGR at 5th %ile. Normal AFI and normal UA dopplers. She has no complicating factors. So, will consider IOL at 39 weeks.  Will follow with twice weekly NSTs. Will repeat AFI/UA dopplers in one week. Stressed kick counts.  Recommendation from ACOG to deliver between [redacted]w[redacted]d and [redacted]w[redacted]d.    Return in about 1 week (around 11/04/2018) for ROB/NST, schedule u/s for afi/dopplers with ROB/NST 1 week.  Prentice Docker, MD, [facility], [facility] 10/28/2018 1:35 PM

## 2018-11-01 ENCOUNTER — Encounter: Payer: Self-pay | Admitting: Maternal Newborn

## 2018-11-01 ENCOUNTER — Ambulatory Visit (INDEPENDENT_AMBULATORY_CARE_PROVIDER_SITE_OTHER): Payer: Medicaid Other | Admitting: Maternal Newborn

## 2018-11-01 ENCOUNTER — Other Ambulatory Visit: Payer: Self-pay

## 2018-11-01 VITALS — BP 124/74 | Wt 156.0 lb

## 2018-11-01 DIAGNOSIS — O365930 Maternal care for other known or suspected poor fetal growth, third trimester, not applicable or unspecified: Secondary | ICD-10-CM | POA: Diagnosis not present

## 2018-11-01 DIAGNOSIS — O365990 Maternal care for other known or suspected poor fetal growth, unspecified trimester, not applicable or unspecified: Secondary | ICD-10-CM | POA: Insufficient documentation

## 2018-11-01 DIAGNOSIS — Z3A37 37 weeks gestation of pregnancy: Secondary | ICD-10-CM | POA: Diagnosis not present

## 2018-11-01 DIAGNOSIS — Z3480 Encounter for supervision of other normal pregnancy, unspecified trimester: Secondary | ICD-10-CM

## 2018-11-01 NOTE — Patient Instructions (Signed)

## 2018-11-01 NOTE — Progress Notes (Signed)
    Routine Prenatal Care Visit  Subjective  Diana Stuart is a 28 y.o. G3P0020 at [redacted]w[redacted]d being seen today for ongoing prenatal care.  She is currently monitored for the following issues for this low-risk pregnancy and has Supervision of other normal pregnancy, antepartum; Uterine fibroid during pregnancy, antepartum; Anemia affecting pregnancy; and IUGR (intrauterine growth restriction) affecting care of mother on their problem list.  ----------------------------------------------------------------------------------- Patient reports some shooting vaginal pains occasionally.   Contractions: Not present. Vag. Bleeding: None.  Movement: Present. No leaking of fluid.  ----------------------------------------------------------------------------------- The following portions of the patient's history were reviewed and updated as appropriate: allergies, current medications, past family history, past medical history, past social history, past surgical history and problem list. Problem list updated.   Objective  Blood pressure 124/74, weight 156 lb (70.8 kg), last menstrual period 02/17/2018. Pregravid weight 120 lb (54.4 kg) Total Weight Gain 36 lb (16.3 kg)  Fetal Status: Fetal Heart Rate (bpm): 135   Movement: Present     General:  Alert, oriented and cooperative. Patient is in no acute distress.  Skin: Skin is warm and dry. No rash noted.   Cardiovascular: Normal heart rate noted  Respiratory: Normal respiratory effort, no problems with respiration noted  Abdomen: Soft, gravid, appropriate for gestational age. Pain/Pressure: Absent     Pelvic:  Cervical exam deferred        Extremities: Normal range of motion.  Edema: None  Mental Status: Normal mood and affect. Normal behavior. Normal judgment and thought content.   NST Baseline: 135 bpm Variability: moderate Accelerations: present Decelerations: absent Tocometry: not done The patient was monitored for 20 minutes, fetal heart rate  tracing was deemed reactive.  Assessment   28 y.o. G3P0020 at [redacted]w[redacted]d, EDD 11/16/2018 by Ultrasound presenting for a routine prenatal visit.  Plan   pregnancy Problems (from 04/29/18 to present)    Problem Noted Resolved   Supervision of other normal pregnancy, antepartum 04/29/2018 by Rod Can, CNM No   Overview Addendum 09/15/2018  8:45 AM by Rod Can, [place] Prenatal Labs  Dating  12 wk [place] Blood type: A/Positive/-- (02/28 1136)   Genetic Screen declined screening Antibody:Negative (02/28 1136)  Anatomic [place]  complete, normal XY Rubella: 4.90 (02/28 1136)  Varicella: Immune  GTT Third trimester:  RPR: Non Reactive (02/28 1136)   Rhogam Not needed HBsAg: Negative (02/28 1136)   TDaP vaccine  Flu Shot: declined HIV: Non Reactive (02/28 1136)   Baby Food   Breast                             GBS:   Contraception Non-hormonal Pap: 04/29/18 NIL  CBB     CS/VBAC NA   [place]  Hx SAB x2, failed IUI          Reactive NST, good fetal movement.  Term labor symptoms and general obstetric precautions including but not limited to vaginal bleeding, contractions, leaking of fluid and fetal movement were reviewed.  Please refer to After Visit Summary for other counseling recommendations.   Return in about 1 week (around 11/08/2018) for ROB with NST. And keep scheduled visit 9/4.  Avel Sensor, CNM 11/01/2018  5:20 PM

## 2018-11-01 NOTE — Progress Notes (Signed)
No vb. No lof. NST today °

## 2018-11-04 ENCOUNTER — Encounter: Payer: Self-pay | Admitting: Advanced Practice Midwife

## 2018-11-04 ENCOUNTER — Other Ambulatory Visit: Payer: Self-pay

## 2018-11-04 ENCOUNTER — Ambulatory Visit (INDEPENDENT_AMBULATORY_CARE_PROVIDER_SITE_OTHER): Payer: Medicaid Other | Admitting: Advanced Practice Midwife

## 2018-11-04 ENCOUNTER — Ambulatory Visit (INDEPENDENT_AMBULATORY_CARE_PROVIDER_SITE_OTHER): Payer: Medicaid Other

## 2018-11-04 VITALS — BP 120/82 | Wt 155.0 lb

## 2018-11-04 DIAGNOSIS — Z3A38 38 weeks gestation of pregnancy: Secondary | ICD-10-CM

## 2018-11-04 DIAGNOSIS — O365930 Maternal care for other known or suspected poor fetal growth, third trimester, not applicable or unspecified: Secondary | ICD-10-CM | POA: Diagnosis not present

## 2018-11-04 DIAGNOSIS — O0993 Supervision of high risk pregnancy, unspecified, third trimester: Secondary | ICD-10-CM

## 2018-11-04 DIAGNOSIS — Z3480 Encounter for supervision of other normal pregnancy, unspecified trimester: Secondary | ICD-10-CM

## 2018-11-04 NOTE — Progress Notes (Signed)
  Routine Prenatal Care Visit  Subjective  Diana Stuart is a 28 y.o. G3P0020 at [redacted]w[redacted]d being seen today for ongoing prenatal care.  She is currently monitored for the following issues for this high-risk pregnancy and has Supervision of high-risk pregnancy; Uterine fibroid during pregnancy, antepartum; Anemia affecting pregnancy; and IUGR (intrauterine growth restriction) affecting care of mother on their problem list.  ----------------------------------------------------------------------------------- Patient reports no complaints.   Contractions: Not present. Vag. Bleeding: None.  Movement: Present. Leaking Fluid denies.  ----------------------------------------------------------------------------------- The following portions of the patient's history were reviewed and updated as appropriate: allergies, current medications, past family history, past medical history, past social history, past surgical history and problem list. Problem list updated.  Objective  Blood pressure 120/82, weight 155 lb (70.3 kg), last menstrual period 02/17/2018. Pregravid weight 120 lb (54.4 kg) Total Weight Gain 35 lb (15.9 kg) Urinalysis: Urine Protein    Urine Glucose    Fetal Status: Fetal Heart Rate (bpm): 135   Movement: Present      Dopplers/AFI: normal dopplers, AFI: 10.1 NST 20 minute tracing reactive, 135 bpm baseline, moderate variability, +accelerations 15 x 15, -decelerations  General:  Alert, oriented and cooperative. Patient is in no acute distress.  Skin: Skin is warm and dry. No rash noted.   Cardiovascular: Normal heart rate noted  Respiratory: Normal respiratory effort, no problems with respiration noted  Abdomen: Soft, gravid, appropriate for gestational age. Pain/Pressure: Present     Pelvic:  Cervical exam deferred        Extremities: Normal range of motion.  Edema: None  Mental Status: Normal mood and affect. Normal behavior. Normal judgment and thought content.   Assessment    28 y.o. G3P0020 at [redacted]w[redacted]d by  11/16/2018, by Ultrasound presenting for routine prenatal visit  Plan   pregnancy Problems (from 04/29/18 to present)    Problem Noted Resolved   IUGR (intrauterine growth restriction) affecting care of mother 11/01/2018 by Rexene Agent, CNM No   Supervision of high-risk pregnancy 04/29/2018 by Rod Can, CNM No   Overview Addendum 11/04/2018 11:01 AM by Rod Can, [place] Prenatal Labs  Dating  12 wk [place] Blood type: A/Positive/-- (02/28 1136)   Genetic Screen declined screening Antibody:Negative (02/28 1136)  Anatomic [place]  complete, normal XY Rubella: 4.90 (02/28 1136)  Varicella: Immune  GTT Third trimester:  RPR: Non Reactive (02/28 1136)   Rhogam Not needed HBsAg: Negative (02/28 1136)   TDaP vaccine  Flu Shot: declined HIV: Non Reactive (02/28 1136)   Baby Food   Breast                             GBS:   Contraception Non-hormonal Pap: 04/29/18 NIL  CBB     CS/VBAC NA IUGR: iol at [place]  Hx SAB x2, failed IUI             Term labor symptoms and general obstetric precautions including but not limited to vaginal bleeding, contractions, leaking of fluid and fetal movement were reviewed in detail with the patient. IOL scheduled for 11/10/18 at 8am  Return in about 4 days (around 11/08/2018) for afi/dopplers/nst/rob.  Rod Can, CNM 11/04/2018 11:03 AM

## 2018-11-08 ENCOUNTER — Other Ambulatory Visit
Admission: RE | Admit: 2018-11-08 | Discharge: 2018-11-08 | Disposition: A | Discharge status: Home / Self Care | Payer: Medicaid Other | Source: Ambulatory Visit | Attending: Obstetrics and Gynecology | Admitting: Obstetrics and Gynecology

## 2018-11-08 DIAGNOSIS — Z20828 Contact with and (suspected) exposure to other viral communicable diseases: Secondary | ICD-10-CM | POA: Diagnosis not present

## 2018-11-08 DIAGNOSIS — Z01812 Encounter for preprocedural laboratory examination: Secondary | ICD-10-CM | POA: Diagnosis not present

## 2018-11-09 ENCOUNTER — Observation Stay
Admission: EM | Admit: 2018-11-09 | Discharge: 2018-11-09 | Disposition: A | Discharge status: Home / Self Care | Payer: Medicaid Other | Source: Home / Self Care | Admitting: Obstetrics and Gynecology

## 2018-11-09 ENCOUNTER — Other Ambulatory Visit: Payer: Self-pay

## 2018-11-09 ENCOUNTER — Ambulatory Visit (INDEPENDENT_AMBULATORY_CARE_PROVIDER_SITE_OTHER): Payer: Medicaid Other

## 2018-11-09 ENCOUNTER — Ambulatory Visit (INDEPENDENT_AMBULATORY_CARE_PROVIDER_SITE_OTHER): Payer: Medicaid Other | Admitting: Certified Nurse Midwife

## 2018-11-09 VITALS — BP 120/80 | Wt 160.6 lb

## 2018-11-09 DIAGNOSIS — O365930 Maternal care for other known or suspected poor fetal growth, third trimester, not applicable or unspecified: Secondary | ICD-10-CM | POA: Insufficient documentation

## 2018-11-09 DIAGNOSIS — O0993 Supervision of high risk pregnancy, unspecified, third trimester: Secondary | ICD-10-CM

## 2018-11-09 DIAGNOSIS — Z3A39 39 weeks gestation of pregnancy: Secondary | ICD-10-CM

## 2018-11-09 DIAGNOSIS — Z3A38 38 weeks gestation of pregnancy: Secondary | ICD-10-CM

## 2018-11-09 DIAGNOSIS — Z87891 Personal history of nicotine dependence: Secondary | ICD-10-CM | POA: Insufficient documentation

## 2018-11-09 DIAGNOSIS — R569 Unspecified convulsions: Secondary | ICD-10-CM | POA: Insufficient documentation

## 2018-11-09 DIAGNOSIS — Z79899 Other long term (current) drug therapy: Secondary | ICD-10-CM | POA: Insufficient documentation

## 2018-11-09 LAB — POCT URINALYSIS DIPSTICK OB
Glucose, UA: NEGATIVE
POC,PROTEIN,UA: NEGATIVE

## 2018-11-09 LAB — SARS CORONAVIRUS 2 (TAT 6-24 HRS): SARS Coronavirus 2: NEGATIVE

## 2018-11-09 MED ORDER — FLUCONAZOLE 150 MG PO TABS: 150.0000 mg | ORAL_TABLET | Freq: Once | ORAL | 0 refills | Status: AC

## 2018-11-09 NOTE — Discharge Summary (Signed)
Physician Final Progress Note  Patient ID: Diana Stuart MRN: [id number] DOB/AGE: [birth year] 28 y.o.  Admit date: 11/09/2018 Admitting provider: Homero Fellers, MD Discharge date: 11/09/2018   Admission Diagnoses: Intrauterine growth restriction affecting care of mother  Discharge Diagnoses: Same  History of Present Illness: The patient is a 28 y.o. female G3P0020 at [redacted]w[redacted]d who presents from clinic from a routine OB appointment and monitoring for IUGR due to difficulty obtaining a reactive NST in the office. Her baby is very active, and it was difficult to tell the baseline and whether there were accelerations or decelerations without tocometry in clinic. She had not had any vaginal bleeding or loss of fluid. She occasionally feels a contraction.  She had a reactive NST in triage with uterine irritability and occasional contractions, most of which patient did not perceive (felt one contraction during the monitoring period).  Review of Systems: Review of systems negative unless otherwise noted in HPI.   Past Medical History:  Diagnosis Date  . Miscarriage   . Seizure [facility])     History reviewed. No pertinent surgical history.  No current facility-administered medications on file prior to encounter.    Current Outpatient Medications on File Prior to Encounter  Medication Sig Dispense Refill  . ferrous sulfate (FERROUSUL) 325 (65 FE) MG tablet Take 1 tablet (325 mg total) by mouth daily. 30 tablet 3  . Prenatal Vit-Fe Fumarate-FA (MULTIVITAMIN-PRENATAL) 27-0.8 MG TABS tablet Take 1 tablet by mouth daily at 12 noon.    . fluconazole (DIFLUCAN) 150 MG tablet Take 1 tablet (150 mg total) by mouth once for 1 dose. 1 tablet 0  . ondansetron (ZOFRAN ODT) 4 MG disintegrating tablet Take 1 tablet (4 mg total) by mouth every 6 (six) hours as needed for nausea. (Patient not taking: Reported on 11/09/2018) 20 tablet 0    Allergies  Allergen Reactions  . Grape Flavor [Grape (Artificial)  Flavor] Hives    Social History   Socioeconomic History  . Marital status: Married    Spouse name: Not on file  . Number of children: Not on file  . Years of education: Not on file  . Highest education level: Not on file  Occupational History  . Not on file  Social Needs  . Financial resource strain: Not hard at all  . Food insecurity    Worry: Never true    Inability: Never true  . Transportation needs    Medical: No    Non-medical: No  Tobacco Use  . Smoking status: Former Smoker    Types: Cigarettes  . Smokeless tobacco: Never Used  Substance and Sexual Activity  . Alcohol use: Yes  . Drug use: No  . Sexual activity: Yes    Birth control/protection: None  Lifestyle  . Physical activity    Days per week: 0 days    Minutes per session: 0 min  . Stress: Not at all  Relationships  . Social connections    Talks on phone: More than three times a week    Gets together: More than three times a week    Attends religious service: Never    Active member of club or organization: No    Attends meetings of clubs or organizations: Never    Relationship status: Married  . Intimate partner violence    Fear of current or ex partner: No    Emotionally abused: No    Physically abused: No    Forced sexual activity: Not on file  Other  Topics Concern  . Not on file  Social History Narrative  . Not on file    Family History  Problem Relation Age of Onset  . Lung cancer Maternal Grandfather 60  . Breast cancer Paternal Grandmother 20     Physical Exam: BP 121/78 (BP Location: Right Arm)   Pulse 71   Temp 98.3 F (36.8 C) (Oral)   Resp 16   Ht 5' (1.524 m)   Wt 72.6 kg   LMP 02/17/2018 (Exact Date)   BMI 31.25 kg/m   Gen: NAD Pulm: No increased work of breathing Pelvic: Deferred Ext: No signs of DVT Psych: Mood appropriate, affect full  NST Baseline: 130 bpm Variability: moderate Accelerations: present Decelerations: absent Tocometry: uterine irritability,  occasional contractions The patient was monitored for 40 minutes, fetal heart rate tracing was deemed reactive, category I tracing.  Consults: None   Significant Findings/ Diagnostic Studies: reactive NST  Procedures: NST  Discharge Condition: good  Disposition: Discharge disposition: 01-Home or Self Care       Diet: Regular diet  Discharge Activity: Activity as tolerated  Discharge Instructions    Discharge activity:  No Restrictions   Complete by: As directed    Discharge diet:  No restrictions   Complete by: As directed    Fetal Kick Count:  Lie on our left side for one hour after a meal, and count the number of times your baby kicks.  If it is less than 5 times, get up, move around and drink some juice.  Repeat the test 30 minutes later.  If it is still less than 5 kicks in an hour, notify your doctor.   Complete by: As directed    LABOR:  When conractions begin, you should start to time them from the beginning of one contraction to the beginning  of the next.  When contractions are 5 - 10 minutes apart or less and have been regular for at least an hour, you should call your health care provider.   Complete by: As directed    No sexual activity restrictions   Complete by: As directed    Notify physician for bleeding from the vagina   Complete by: As directed    Notify physician for blurring of vision or spots before the eyes   Complete by: As directed    Notify physician for chills or fever   Complete by: As directed    Notify physician for fainting spells, "black outs" or loss of consciousness   Complete by: As directed    Notify physician for increase in vaginal discharge   Complete by: As directed    Notify physician for leaking of fluid   Complete by: As directed    Notify physician for pain or burning when urinating   Complete by: As directed    Notify physician for pelvic pressure (sudden increase)   Complete by: As directed    Notify physician for severe or  continued nausea or vomiting   Complete by: As directed    Notify physician for sudden gushing of fluid from the vagina (with or without continued leaking)   Complete by: As directed    Notify physician for sudden, constant, or occasional abdominal pain   Complete by: As directed    Notify physician if baby moving less than usual   Complete by: As directed      Allergies as of 11/09/2018      Reactions   Grape Flavor [grape (artificial) Flavor] Hives  Medication List    TAKE these medications   ferrous sulfate 325 (65 FE) MG tablet Commonly known as: FerrouSul Take 1 tablet (325 mg total) by mouth daily.   fluconazole 150 MG tablet Commonly known as: DIFLUCAN Take 1 tablet (150 mg total) by mouth once for 1 dose.   multivitamin-prenatal 27-0.8 MG Tabs tablet Take 1 tablet by mouth daily at 12 noon.   ondansetron 4 MG disintegrating tablet Commonly known as: Zofran ODT Take 1 tablet (4 mg total) by mouth every 6 (six) hours as needed for nausea.      Reactive NST, fetal well-being reassuring. Return tomorrow at 0800 for induction of labor, or sooner with signs of labor or decreased fetal movement.  Signed: Rexene Agent, CNM  11/09/2018, 12:09 PM

## 2018-11-09 NOTE — OB Triage Note (Signed)
Pt G3P0 [redacted]w[redacted]d presents to L&D from office for NST. Pt denies vaginal bleeding, discharge, LOF, and ctx and reports + FM. VSS and no concerns from the pt. Monitors applied and assessing.

## 2018-11-09 NOTE — Progress Notes (Signed)
C/o swelling.rj

## 2018-11-09 NOTE — Progress Notes (Signed)
D/C instructions reviewed with pt. Went over labor precautions and when to seek medical attention/call the provider. Pt verbalized understanding.

## 2018-11-10 ENCOUNTER — Inpatient Hospital Stay: Payer: Medicaid Other | Admitting: Registered Nurse

## 2018-11-10 ENCOUNTER — Inpatient Hospital Stay
Admission: RE | Admit: 2018-11-10 | Discharge: 2018-11-12 | DRG: 787 | Disposition: A | Discharge status: Home / Self Care | Payer: Medicaid Other | Attending: Obstetrics and Gynecology | Admitting: Obstetrics and Gynecology

## 2018-11-10 ENCOUNTER — Encounter
Admission: RE | Disposition: A | Discharge status: Home / Self Care | Payer: Self-pay | Source: Home / Self Care | Attending: Obstetrics and Gynecology

## 2018-11-10 ENCOUNTER — Other Ambulatory Visit: Payer: Self-pay

## 2018-11-10 DIAGNOSIS — Z3A39 39 weeks gestation of pregnancy: Secondary | ICD-10-CM | POA: Diagnosis not present

## 2018-11-10 DIAGNOSIS — O3413 Maternal care for benign tumor of corpus uteri, third trimester: Secondary | ICD-10-CM | POA: Diagnosis present

## 2018-11-10 DIAGNOSIS — D259 Leiomyoma of uterus, unspecified: Secondary | ICD-10-CM | POA: Diagnosis present

## 2018-11-10 DIAGNOSIS — O99824 Streptococcus B carrier state complicating childbirth: Secondary | ICD-10-CM | POA: Diagnosis present

## 2018-11-10 DIAGNOSIS — D62 Acute posthemorrhagic anemia: Secondary | ICD-10-CM | POA: Diagnosis not present

## 2018-11-10 DIAGNOSIS — O365930 Maternal care for other known or suspected poor fetal growth, third trimester, not applicable or unspecified: Secondary | ICD-10-CM | POA: Diagnosis present

## 2018-11-10 DIAGNOSIS — O9081 Anemia of the puerperium: Secondary | ICD-10-CM | POA: Diagnosis not present

## 2018-11-10 DIAGNOSIS — B373 Candidiasis of vulva and vagina: Secondary | ICD-10-CM | POA: Diagnosis present

## 2018-11-10 DIAGNOSIS — Z20828 Contact with and (suspected) exposure to other viral communicable diseases: Secondary | ICD-10-CM | POA: Diagnosis present

## 2018-11-10 DIAGNOSIS — O0993 Supervision of high risk pregnancy, unspecified, third trimester: Secondary | ICD-10-CM

## 2018-11-10 DIAGNOSIS — O0990 Supervision of high risk pregnancy, unspecified, unspecified trimester: Secondary | ICD-10-CM

## 2018-11-10 DIAGNOSIS — O9882 Other maternal infectious and parasitic diseases complicating childbirth: Secondary | ICD-10-CM | POA: Diagnosis present

## 2018-11-10 DIAGNOSIS — Z87891 Personal history of nicotine dependence: Secondary | ICD-10-CM

## 2018-11-10 DIAGNOSIS — O99013 Anemia complicating pregnancy, third trimester: Secondary | ICD-10-CM

## 2018-11-10 DIAGNOSIS — Z3490 Encounter for supervision of normal pregnancy, unspecified, unspecified trimester: Secondary | ICD-10-CM

## 2018-11-10 DIAGNOSIS — O365990 Maternal care for other known or suspected poor fetal growth, unspecified trimester, not applicable or unspecified: Secondary | ICD-10-CM | POA: Diagnosis present

## 2018-11-10 DIAGNOSIS — O99019 Anemia complicating pregnancy, unspecified trimester: Secondary | ICD-10-CM | POA: Diagnosis present

## 2018-11-10 DIAGNOSIS — Z98891 History of uterine scar from previous surgery: Secondary | ICD-10-CM

## 2018-11-10 LAB — CBC
HCT: 35.7 % — ABNORMAL LOW (ref 36.0–46.0)
Hemoglobin: 11.5 g/dL — ABNORMAL LOW (ref 12.0–15.0)
MCH: 27 pg (ref 26.0–34.0)
MCHC: 32.2 g/dL (ref 30.0–36.0)
MCV: 83.8 fL (ref 80.0–100.0)
Platelets: 291 10*3/uL (ref 150–400)
RBC: 4.26 MIL/uL (ref 3.87–5.11)
RDW: 15 % (ref 11.5–15.5)
WBC: 9.8 10*3/uL (ref 4.0–10.5)
nRBC: 0 % (ref 0.0–0.2)

## 2018-11-10 LAB — TYPE AND SCREEN
ABO/RH(D): A POS
Antibody Screen: NEGATIVE

## 2018-11-10 SURGERY — Surgical Case
Anesthesia: Spinal | Site: Abdomen

## 2018-11-10 MED ORDER — SENNOSIDES-DOCUSATE SODIUM 8.6-50 MG PO TABS
2.0000 | ORAL_TABLET | ORAL | Status: DC
  Administered 2018-11-12: 2 via ORAL
  Filled 2018-11-10: qty 2

## 2018-11-10 MED ORDER — ACETAMINOPHEN 500 MG PO TABS
1000.0000 mg | ORAL_TABLET | Freq: Four times a day (QID) | ORAL | Status: AC
  Administered 2018-11-11 (×2): 1000 mg via ORAL
  Filled 2018-11-10 (×3): qty 2

## 2018-11-10 MED ORDER — OXYCODONE-ACETAMINOPHEN 5-325 MG PO TABS: 2.0000 | ORAL_TABLET | ORAL | Status: DC | PRN

## 2018-11-10 MED ORDER — OXYCODONE-ACETAMINOPHEN 5-325 MG PO TABS: 1.0000 | ORAL_TABLET | ORAL | Status: DC | PRN

## 2018-11-10 MED ORDER — MISOPROSTOL 25 MCG QUARTER TABLET
25.0000 ug | ORAL_TABLET | ORAL | Status: DC | PRN
  Administered 2018-11-10: 10:00:00 25 ug via VAGINAL
  Filled 2018-11-10: qty 1

## 2018-11-10 MED ORDER — MORPHINE SULFATE (PF) 0.5 MG/ML IJ SOLN
INTRAMUSCULAR | Status: DC | PRN
  Administered 2018-11-10: .2 mg via EPIDURAL

## 2018-11-10 MED ORDER — SUCCINYLCHOLINE CHLORIDE 20 MG/ML IJ SOLN
INTRAMUSCULAR | Status: AC
  Filled 2018-11-10: qty 1

## 2018-11-10 MED ORDER — MENTHOL 3 MG MT LOZG
1.0000 | LOZENGE | OROMUCOSAL | Status: DC | PRN
  Filled 2018-11-10: qty 9

## 2018-11-10 MED ORDER — BUPIVACAINE IN DEXTROSE 0.75-8.25 % IT SOLN
INTRATHECAL | Status: DC | PRN
  Administered 2018-11-10: 1.3 mL via INTRATHECAL

## 2018-11-10 MED ORDER — NALOXONE HCL 0.4 MG/ML IJ SOLN: 0.4000 mg | INTRAMUSCULAR | Status: DC | PRN

## 2018-11-10 MED ORDER — TERBUTALINE SULFATE 1 MG/ML IJ SOLN
INTRAMUSCULAR | Status: AC
  Filled 2018-11-10: qty 1

## 2018-11-10 MED ORDER — FENTANYL CITRATE (PF) 100 MCG/2ML IJ SOLN: 25.0000 ug | INTRAMUSCULAR | Status: DC | PRN

## 2018-11-10 MED ORDER — OXYTOCIN 40 UNITS IN NORMAL SALINE INFUSION - SIMPLE MED
2.5000 [IU]/h | INTRAVENOUS | Status: AC
  Administered 2018-11-11: 2.5 [IU]/h via INTRAVENOUS
  Filled 2018-11-10: qty 1000

## 2018-11-10 MED ORDER — ONDANSETRON HCL 4 MG/2ML IJ SOLN: 4.0000 mg | Freq: Three times a day (TID) | INTRAMUSCULAR | Status: DC | PRN

## 2018-11-10 MED ORDER — LACTATED RINGERS IV SOLN
INTRAVENOUS | Status: DC
  Administered 2018-11-10 (×2): via INTRAVENOUS

## 2018-11-10 MED ORDER — OXYTOCIN 40 UNITS IN NORMAL SALINE INFUSION - SIMPLE MED
2.5000 [IU]/h | INTRAVENOUS | Status: DC
  Administered 2018-11-10: 1000 mL via INTRAVENOUS

## 2018-11-10 MED ORDER — MORPHINE SULFATE (PF) 0.5 MG/ML IJ SOLN
INTRAMUSCULAR | Status: AC
  Filled 2018-11-10: qty 10

## 2018-11-10 MED ORDER — NALBUPHINE HCL 10 MG/ML IJ SOLN: 5.0000 mg | Freq: Once | INTRAMUSCULAR | Status: DC | PRN

## 2018-11-10 MED ORDER — WITCH HAZEL-GLYCERIN EX PADS: 1.0000 "application " | MEDICATED_PAD | CUTANEOUS | Status: DC | PRN

## 2018-11-10 MED ORDER — PHENYLEPHRINE HCL (PRESSORS) 10 MG/ML IV SOLN
INTRAVENOUS | Status: DC | PRN
  Administered 2018-11-10 (×4): 100 ug via INTRAVENOUS

## 2018-11-10 MED ORDER — BUPIVACAINE 0.25 % ON-Q PUMP DUAL CATH 400 ML
400.0000 mL | INJECTION | Status: DC
  Filled 2018-11-10: qty 400

## 2018-11-10 MED ORDER — SOD CITRATE-CITRIC ACID 500-334 MG/5ML PO SOLN
ORAL | Status: AC
  Filled 2018-11-10: qty 30

## 2018-11-10 MED ORDER — CARBOPROST TROMETHAMINE 250 MCG/ML IM SOLN
INTRAMUSCULAR | Status: AC
  Filled 2018-11-10: qty 1

## 2018-11-10 MED ORDER — PRENATAL MULTIVITAMIN CH
1.0000 | ORAL_TABLET | Freq: Every day | ORAL | Status: DC
  Administered 2018-11-11 – 2018-11-12 (×2): 1 via ORAL
  Filled 2018-11-10 (×2): qty 1

## 2018-11-10 MED ORDER — PROPOFOL 10 MG/ML IV BOLUS
INTRAVENOUS | Status: AC
  Filled 2018-11-10: qty 20

## 2018-11-10 MED ORDER — CEFAZOLIN SODIUM-DEXTROSE 2-4 GM/100ML-% IV SOLN
2.0000 g | INTRAVENOUS | Status: AC
  Administered 2018-11-10: 17:00:00 2 g via INTRAVENOUS
  Filled 2018-11-10: qty 100

## 2018-11-10 MED ORDER — METHYLERGONOVINE MALEATE 0.2 MG/ML IJ SOLN
INTRAMUSCULAR | Status: AC
  Filled 2018-11-10: qty 1

## 2018-11-10 MED ORDER — DIPHENHYDRAMINE HCL 25 MG PO CAPS: 25.0000 mg | ORAL_CAPSULE | Freq: Four times a day (QID) | ORAL | Status: DC | PRN

## 2018-11-10 MED ORDER — LIDOCAINE HCL (PF) 1 % IJ SOLN: 30.0000 mL | INTRAMUSCULAR | Status: DC | PRN

## 2018-11-10 MED ORDER — BUPIVACAINE HCL (PF) 0.5 % IJ SOLN
INTRAMUSCULAR | Status: AC
  Filled 2018-11-10: qty 30

## 2018-11-10 MED ORDER — FERROUS SULFATE 325 (65 FE) MG PO TABS
325.0000 mg | ORAL_TABLET | Freq: Two times a day (BID) | ORAL | Status: DC
  Administered 2018-11-11 – 2018-11-12 (×3): 325 mg via ORAL
  Filled 2018-11-10 (×3): qty 1

## 2018-11-10 MED ORDER — MISOPROSTOL 200 MCG PO TABS
ORAL_TABLET | ORAL | Status: AC
  Filled 2018-11-10: qty 5

## 2018-11-10 MED ORDER — NALBUPHINE HCL 10 MG/ML IJ SOLN: 5.0000 mg | INTRAMUSCULAR | Status: DC | PRN

## 2018-11-10 MED ORDER — TERBUTALINE SULFATE 1 MG/ML IJ SOLN
0.2500 mg | Freq: Once | INTRAMUSCULAR | Status: AC | PRN
  Administered 2018-11-10: 0.25 mg via SUBCUTANEOUS

## 2018-11-10 MED ORDER — LIDOCAINE HCL (PF) 2 % IJ SOLN
INTRAMUSCULAR | Status: AC
  Filled 2018-11-10: qty 10

## 2018-11-10 MED ORDER — SODIUM CHLORIDE 0.9 % IV SOLN
INTRAVENOUS | Status: DC | PRN
  Administered 2018-11-10: 30 ug/min via INTRAVENOUS

## 2018-11-10 MED ORDER — SOD CITRATE-CITRIC ACID 500-334 MG/5ML PO SOLN
30.0000 mL | ORAL | Status: AC
  Administered 2018-11-10: 30 mL via ORAL

## 2018-11-10 MED ORDER — KETOROLAC TROMETHAMINE 30 MG/ML IJ SOLN: 30.0000 mg | Freq: Four times a day (QID) | INTRAMUSCULAR | Status: AC | PRN

## 2018-11-10 MED ORDER — DIPHENHYDRAMINE HCL 25 MG PO CAPS: 25.0000 mg | ORAL_CAPSULE | ORAL | Status: DC | PRN

## 2018-11-10 MED ORDER — ONDANSETRON HCL 4 MG/2ML IJ SOLN: 4.0000 mg | Freq: Once | INTRAMUSCULAR | Status: DC | PRN

## 2018-11-10 MED ORDER — SODIUM CHLORIDE 0.9 % IV SOLN
5.0000 10*6.[IU] | Freq: Once | INTRAVENOUS | Status: AC
  Administered 2018-11-10: 5 10*6.[IU] via INTRAVENOUS
  Filled 2018-11-10: qty 5

## 2018-11-10 MED ORDER — PENICILLIN G 3 MILLION UNITS IVPB - SIMPLE MED
3.0000 10*6.[IU] | INTRAVENOUS | Status: DC
  Administered 2018-11-10: 3 10*6.[IU] via INTRAVENOUS
  Filled 2018-11-10: qty 100

## 2018-11-10 MED ORDER — IBUPROFEN 600 MG PO TABS
600.0000 mg | ORAL_TABLET | Freq: Four times a day (QID) | ORAL | Status: DC
  Administered 2018-11-11 – 2018-11-12 (×4): 600 mg via ORAL
  Filled 2018-11-10 (×4): qty 1

## 2018-11-10 MED ORDER — MEPERIDINE HCL 25 MG/ML IJ SOLN: 6.2500 mg | INTRAMUSCULAR | Status: DC | PRN

## 2018-11-10 MED ORDER — ACETAMINOPHEN 325 MG PO TABS: 650.0000 mg | ORAL_TABLET | ORAL | Status: DC | PRN

## 2018-11-10 MED ORDER — BUPIVACAINE HCL (PF) 0.5 % IJ SOLN: 5.0000 mL | Freq: Once | INTRAMUSCULAR | Status: DC

## 2018-11-10 MED ORDER — SIMETHICONE 80 MG PO CHEW
80.0000 mg | CHEWABLE_TABLET | Freq: Three times a day (TID) | ORAL | Status: DC
  Administered 2018-11-11 – 2018-11-12 (×5): 80 mg via ORAL
  Filled 2018-11-10 (×5): qty 1

## 2018-11-10 MED ORDER — OXYTOCIN BOLUS FROM INFUSION: 500.0000 mL | Freq: Once | INTRAVENOUS | Status: DC

## 2018-11-10 MED ORDER — BUPIVACAINE HCL 0.5 % IJ SOLN
INTRAMUSCULAR | Status: DC | PRN
  Administered 2018-11-10: 10 mL

## 2018-11-10 MED ORDER — COCONUT OIL OIL: 1.0000 "application " | TOPICAL_OIL | Status: DC | PRN

## 2018-11-10 MED ORDER — LACTATED RINGERS IV SOLN
500.0000 mL | INTRAVENOUS | Status: DC | PRN
  Administered 2018-11-10: 500 mL via INTRAVENOUS

## 2018-11-10 MED ORDER — SODIUM CHLORIDE 0.9% FLUSH: 3.0000 mL | INTRAVENOUS | Status: DC | PRN

## 2018-11-10 MED ORDER — DIBUCAINE (PERIANAL) 1 % EX OINT: 1.0000 "application " | TOPICAL_OINTMENT | CUTANEOUS | Status: DC | PRN

## 2018-11-10 MED ORDER — KETOROLAC TROMETHAMINE 30 MG/ML IJ SOLN
30.0000 mg | Freq: Four times a day (QID) | INTRAMUSCULAR | Status: AC | PRN
  Administered 2018-11-11: 30 mg via INTRAVENOUS
  Filled 2018-11-10: qty 1

## 2018-11-10 MED ORDER — ONDANSETRON HCL 4 MG/2ML IJ SOLN
4.0000 mg | Freq: Four times a day (QID) | INTRAMUSCULAR | Status: DC | PRN
  Administered 2018-11-10: 17:00:00 4 mg via INTRAVENOUS

## 2018-11-10 MED ORDER — DIPHENHYDRAMINE HCL 50 MG/ML IJ SOLN: 12.5000 mg | INTRAMUSCULAR | Status: DC | PRN

## 2018-11-10 MED ORDER — BUTORPHANOL TARTRATE 1 MG/ML IJ SOLN: 1.0000 mg | INTRAMUSCULAR | Status: DC | PRN

## 2018-11-10 MED ORDER — LACTATED RINGERS IV SOLN: INTRAVENOUS | Status: DC

## 2018-11-10 MED ORDER — NALOXONE HCL 4 MG/10ML IJ SOLN
1.0000 ug/kg/h | INTRAVENOUS | Status: DC | PRN
  Filled 2018-11-10: qty 5

## 2018-11-10 SURGICAL SUPPLY — 31 items
CANISTER SUCT 3000ML PPV (MISCELLANEOUS) ×2 IMPLANT
CATH KIT ON-Q SILVERSOAK 5IN (CATHETERS) ×4 IMPLANT
COVER WAND RF STERILE (DRAPES) ×2 IMPLANT
DERMABOND ADVANCED (GAUZE/BANDAGES/DRESSINGS) ×1
DERMABOND ADVANCED .7 DNX12 (GAUZE/BANDAGES/DRESSINGS) ×1 IMPLANT
DRSG OPSITE POSTOP 4X10 (GAUZE/BANDAGES/DRESSINGS) ×2 IMPLANT
DRSG OPSITE POSTOP 4X14 (GAUZE/BANDAGES/DRESSINGS) ×2 IMPLANT
DRSG TELFA 3X8 NADH (GAUZE/BANDAGES/DRESSINGS) ×2 IMPLANT
ELECT CAUTERY BLADE 6.4 (BLADE) ×2 IMPLANT
ELECT REM PT RETURN 9FT ADLT (ELECTROSURGICAL) ×2
ELECTRODE REM PT RTRN 9FT ADLT (ELECTROSURGICAL) ×1 IMPLANT
GAUZE SPONGE 4X4 12PLY STRL (GAUZE/BANDAGES/DRESSINGS) ×2 IMPLANT
GLOVE BIO SURGEON STRL SZ7 (GLOVE) ×2 IMPLANT
GLOVE INDICATOR 7.5 STRL GRN (GLOVE) ×2 IMPLANT
GOWN STRL REUS W/ TWL LRG LVL3 (GOWN DISPOSABLE) ×3 IMPLANT
GOWN STRL REUS W/TWL LRG LVL3 (GOWN DISPOSABLE) ×3
NS IRRIG 1000ML POUR BTL (IV SOLUTION) ×2 IMPLANT
PACK C SECTION AR (MISCELLANEOUS) ×2 IMPLANT
PAD OB MATERNITY 4.3X12.25 (PERSONAL CARE ITEMS) ×4 IMPLANT
PAD PREP 24X41 OB/GYN DISP (PERSONAL CARE ITEMS) ×2 IMPLANT
PENCIL SMOKE ULTRAEVAC 22 CON (MISCELLANEOUS) ×2 IMPLANT
STRIP CLOSURE SKIN 1/2X4 (GAUZE/BANDAGES/DRESSINGS) ×2 IMPLANT
SUT MNCRL 4-0 (SUTURE) ×1
SUT MNCRL 4-0 27XMFL (SUTURE) ×1
SUT PDS AB 1 TP1 96 (SUTURE) ×2 IMPLANT
SUT PLAIN GUT 0 (SUTURE) IMPLANT
SUT VIC AB 0 CTX 36 (SUTURE) ×2
SUT VIC AB 0 CTX36XBRD ANBCTRL (SUTURE) ×2 IMPLANT
SUT VICRYL 3-0 27IN SH (SUTURE) ×2 IMPLANT
SUTURE MNCRL 4-0 27XMF (SUTURE) ×1 IMPLANT
SWABSTK COMLB BENZOIN TINCTURE (MISCELLANEOUS) ×2 IMPLANT

## 2018-11-10 NOTE — Progress Notes (Signed)
L&D Progress Note   S: Starting to feel painful contractions. Given first dose of Cytotec 25 mcg vaginally at 1015  O: BP 107/68   Pulse 67   Temp 98.2 F (36.8 C)   Resp 18   Ht 5' (1.524 m)   Wt 72.8 kg   LMP 02/17/2018 (Exact Date)   BMI 31.33 kg/m   General: breathing thru some contractions  There was a fetal heart rate deceleration to 50 BPM x 5 minutes at 1120. There was a concern that there was a prolonged contraction or hyperstimulation at the time of deceleration as the fundus remained firm, although patient did not feel any painful contractions at that time. The prolonged deceleration resolved with position changes (was supine when the contraction occurred) and IV fluid bolus. There were some repetitive decelrations that appeared to be late as baby recovered over the next 30 minutes. The decelerations resolved, and the contractions became stronger and were every 1-2 minutes apart, lasting > 60 sec. Given a dose of terbutaline 0.25 mcg x 1 for hyperstimulation at 1226.  FHR currently 140 baseline with accelerations to 160s, moderate variability   A: s/p prolonged deceleration with ?hyperstimulation after one dose of Cytotec Currently FHR Cat 1  P: Monitor fetal tolerance of contractions as contractions return when terbutaline wears off.  Dr Glennon Mac aware of FHR decelerations and was in to speak with patient.  Dalia Heading, CNM

## 2018-11-10 NOTE — Progress Notes (Signed)
Patient has had a intermittently category 2 tracing since this morning.  She has improved with resuscitation. However, she is remote from delivery at a finger tip in cervical dilation. We had a long discussion with trying to continue induction of labor with more controlled agents.  Specifically, discussed adding a Foley balloon with pitocin.   However, patient requests a primary cesarean delivery, instead. Given fetal growth restriction and category 2 tracing that is occurring intermittently, remote from delivery, I believe she is likely to require a cesarean delivery as the fetus has at times not tolerated minimal contracting with late decelerations. She had a nearly 5 minute deceleration late this morning and has had intermittent late decelerations since that time.  Therefore, based on our discussions and the extremely high likelihood that she would need an emergent cesarean at some point, will move with cesarean delivery. Of note, I was contacted by Dr. Lysle Pearl of general surgery who asked if my patient was stable enough for him to proceed with an emergent surgery for a patient with a bowel perforation.  The patient currently has a category 1 tracing, though she is contracting frequently.  We discussed that a bowel perforation is probably higher acuity than my patient, given the current situation.  However, this could change at any minute.  Will continue to hold NPO until can get her into the OR. I personally consented her for surgery. Will go as soon as we can.  Prentice Docker, MD, [facility], [facility] 11/10/2018 3:44 PM

## 2018-11-10 NOTE — Discharge Summary (Signed)
OB Discharge Summary     Patient Name: Diana Stuart DOB: 1990/07/29 MRN: [id number]  Date of admission: 11/10/2018 Delivering MD: Prentice Docker, MD  Date of Delivery: 11/10/2018  Date of discharge: 11/12/2018  Admitting diagnosis: Fetal growth restriction Intrauterine pregnancy: [redacted]w[redacted]d     Secondary diagnosis: Anemia     Discharge diagnosis: Term Pregnancy Delivered by Cesarean section. Fetal intolerance to labor                                                                                             Post partum procedures:none  Augmentation: Cytotec  Complications: None  Hospital course:  Onset of Labor With Unplanned C/S  28 y.o. yo G3P0020 at [redacted]w[redacted]d was admitted in Latent Labor on 11/10/2018. Patient had a labor course significant for fetal intolerance of labor remote from delivery. Membrane Rupture Time/Date: 5:03 PM ,11/10/2018   The patient went for cesarean section due to Non-Reassuring FHR, and delivered a Viable infant,11/10/2018  Details of operation can be found in separate operative note. Patient had an uncomplicated postpartum course.  She is ambulating,tolerating a regular diet, passing flatus, and urinating well.  Patient is discharged home in stable condition 11/12/18.  Physical exam  Vitals:   11/11/18 1637 11/11/18 1700 11/11/18 2306 11/12/18 0820  BP: 121/76  125/74 123/88  Pulse: 94 91 89 95  Resp: 17  18 18   Temp: 98.3 F (36.8 C)  98.7 F (37.1 C) 98 F (36.7 C)  TempSrc: Oral  Oral   SpO2: 100% 100% 99% 100%  Weight:      Height:       General: alert, cooperative and no distress Lochia: appropriate Uterine Fundus: firm/ U-1/ ML/ NT Incision: Dressing is clean, dry, and intact, ON Q dressing changed. DVT Evaluation: No evidence of DVT seen on physical exam.  Labs: Lab Results  Component Value Date   WBC 12.9 (H) 11/11/2018   HGB 9.7 (L) 11/11/2018   HCT 30.4 (L) 11/11/2018   MCV 84.0 11/11/2018   PLT 239 11/11/2018    Discharge  instruction: per After Visit Summary.  Medications:  Allergies as of 11/12/2018      Reactions   Grape Flavor [grape (artificial) Flavor] Hives      Medication List    STOP taking these medications   ferrous sulfate 325 (65 FE) MG tablet Commonly known as: FerrouSul   fluconazole 150 MG tablet Commonly known as: DIFLUCAN   ondansetron 4 MG disintegrating tablet Commonly known as: Zofran ODT     TAKE these medications   acetaminophen 325 MG tablet Commonly known as: Tylenol Take 2 tablets (650 mg total) by mouth every 6 (six) hours as needed for mild pain or moderate pain.   ibuprofen 600 MG tablet Commonly known as: ADVIL Take 1 tablet (600 mg total) by mouth every 6 (six) hours as needed for mild pain, moderate pain or cramping.   multivitamin-prenatal 27-0.8 MG Tabs tablet Take 1 tablet by mouth daily at 12 noon.   oxyCODONE 5 MG immediate release tablet Commonly known as: Roxicodone Take 1 tablet (5 mg total) by mouth every 6 (six)  hours as needed for up to 5 days for moderate pain, severe pain or breakthrough pain.       Diet: routine diet  Activity: Advance as tolerated. Pelvic rest for 6 weeks.   Outpatient follow up: Follow-up Information    Will Bonnet, MD. Schedule an appointment as soon as possible for a visit in 1 week(s).   Specialty: Obstetrics and Gynecology Why: Post op incision check Contact information: [address] [place] [address] [phone]             Postpartum contraception: Lactational Amenorrhea Method Rhogam Given postpartum: no Rubella vaccine given postpartum: no Varicella vaccine given postpartum: no TDaP given antepartum or postpartum: 09/19/2018  Newborn Data: Live born female Diana Stuart Birth Weight: 5 lb 13.8 oz (2660 g) APGAR: 8, 9  Newborn Delivery   Birth date/time: 11/10/2018 17:03:00 Delivery type: C-Section, Low Transverse Trial of labor: Yes C-section categorization: Primary       Baby  Feeding: Breast  Disposition:home with mother  SIGNED:  Dalia Heading, CNM

## 2018-11-10 NOTE — Anesthesia Post-op Follow-up Note (Signed)
Anesthesia QCDR form completed.        

## 2018-11-10 NOTE — Transfer of Care (Signed)
Immediate Anesthesia Transfer of Care Note  Patient: Diana Stuart  Procedure(s) Performed: PRIMARY CESAREAN SECTION (N/A Abdomen)  Patient Location: PACU  Anesthesia Type:Spinal  Level of Consciousness: awake and alert   Airway & Oxygen Therapy: Patient Spontanous Breathing  Post-op Assessment: Report given to RN and Post -op Vital signs reviewed and stable  Post vital signs: Reviewed and stable  Last Vitals:  Vitals Value Taken Time  BP    Temp    Pulse    Resp    SpO2      Last Pain:  Vitals:   11/10/18 0837  TempSrc:   PainSc: 0-No pain         Complications: No apparent anesthesia complications

## 2018-11-10 NOTE — Anesthesia Preprocedure Evaluation (Signed)
Anesthesia Evaluation  Patient identified by MRN, date of birth, ID band Patient awake    Reviewed: Allergy & Precautions, NPO status , Patient's Chart, lab work & pertinent test results, reviewed documented beta blocker date and time   Airway Mallampati: III  TM Distance: >3 FB     Dental  (+) Chipped   Pulmonary former smoker,           Cardiovascular      Neuro/Psych Seizures -,     GI/Hepatic   Endo/Other    Renal/GU      Musculoskeletal   Abdominal   Peds  Hematology  (+) anemia ,   Anesthesia Other Findings   Reproductive/Obstetrics                             Anesthesia Physical Anesthesia Plan  ASA: II  Anesthesia Plan: Spinal   Post-op Pain Management:    Induction: Intravenous  PONV Risk Score and Plan:   Airway Management Planned:   Additional Equipment:   Intra-op Plan:   Post-operative Plan:   Informed Consent: I have reviewed the patients History and Physical, chart, labs and discussed the procedure including the risks, benefits and alternatives for the proposed anesthesia with the patient or authorized representative who has indicated his/her understanding and acceptance.       Plan Discussed with: CRNA  Anesthesia Plan Comments:         Anesthesia Quick Evaluation

## 2018-11-10 NOTE — H&P (Signed)
OB History & Physical   History of Present Illness:  Chief Complaint:  Presents for induction of labor HPI:  Diana Stuart is a 28 y.o. G70P0020 female with EDC=11/16/2018 at [redacted]w[redacted]d dated by a 12wk1day ultrasound.  Her pregnancy has been complicated by a 5.5 cm fibroid in her lower right uterine segment, anemia (hmg 9.8 gm/dl), GBS bacteruria, and  possible IUGR.  A growth scan done 2 weeks ago estimated fetal weight at 5#6oz (<10th%). NSTs , Dopplers, and AFIs have been normal since then.  Her total weight gain has been 40#.  She presents to L&D for induction of labor.   She reports good fetal movement. Has felt occasional contractions. No vaginal bleeding. She was diagnosed with a monilial vaginitis yesterday and did take her Diflucan.  Prenatal care site: Prenatal care at [facility] has also been remarkable for  [facility] Prenatal Labs  Dating  12 wk [place] Blood type: A/Positive/-- (02/28 1136)   Genetic Screen declined screening Antibody:Negative (02/28 1136)  Anatomic [place]  complete, normal XY Rubella: 4.90 (02/28 1136)  Varicella: Immune  GTT Third trimester: 80 RPR: Non Reactive (02/28 1136)   Rhogam Not needed HBsAg: Negative (02/28 1136)   TDaP vaccine  09/01/2018 Flu Shot: declined HIV: Non Reactive (02/28 1136)   Baby Food   Breast                             GBS: positive baceteruria  Contraception Non-hormonal (LAM) Pap: 04/29/18 NIL  CBB     CS/VBAC NA IUGR: iol at [place]  Hx SAB x2, failed IUI        Maternal Medical History:   Past Medical History:  Diagnosis Date  . Miscarriage   . Seizure [facility])     Past Surgical History:  Procedure Laterality Date  . NO PAST SURGERIES      Allergies  Allergen Reactions  . Grape Flavor [Grape (Artificial) Flavor] Hives    Prior to Admission medications   Medication Sig Start Date End Date Taking? Authorizing Provider  ferrous sulfate (FERROUSUL) 325 (65 FE) MG tablet Take 1 tablet (325 mg total)  by mouth daily. 09/07/18   Dalia Heading, CNM  ondansetron (ZOFRAN ODT) 4 MG disintegrating tablet Take 1 tablet (4 mg total) by mouth every 6 (six) hours as needed for nausea. Patient not taking: Reported on 11/09/2018 09/29/18   Rod Can, CNM  Prenatal Vit-Fe Fumarate-FA (MULTIVITAMIN-PRENATAL) 27-0.8 MG TABS tablet Take 1 tablet by mouth daily at 12 noon.    [provider]      Social History: She  reports that she has quit smoking. Her smoking use included cigarettes. She has never used smokeless tobacco. She reports current alcohol use. She reports that she does not use drugs.  Family History: family history includes Breast cancer (age of onset: 15) in her paternal grandmother; Lung cancer (age of onset: 55) in her maternal grandfather.   Review of Systems: Negative x 10 systems reviewed except as noted in the HPI.      Physical Exam:  Vital Signs:BP 129/90 (BP Location: Left Arm)   Pulse 79   Temp 98.1 F (36.7 C) (Oral)   Resp 18   Ht 5' (1.524 m)   Wt 72.8 kg   LMP 02/17/2018 (Exact Date)   BMI 31.33 kg/m   General: gravid BF in no acute distress.  HEENT: normocephalic, atraumatic Heart: regular rate &  rhythm.  No murmurs/rubs/gallops Lungs: clear to auscultation bilaterally Abdomen: soft, gravid, non-tender;  EFW: 6#4oz Pelvic:   External: Normal external female genitalia  Cervix: external os 1 cm/ int cx closed/60%/-1 Extremities: non-tender, symmetric, no edema bilaterally.   Neurologic: Alert & oriented x 3.   Baseline FHR: baseline 130 with accelerations to 150, moderate variability, no decelerations Toco: irregular mild contractions.  Assessment:  Diana Stuart is a 28 y.o. G68P0020 female at [redacted]w[redacted]d with possible IUGR vs constitutionally small fetus for IOL GBS bacteruria- needs GBS PPX Fibroid in LUS Bishop score: 6   Plan:  1. Admit to Labor & Delivery -discussed plan of management  with DR Glennon Mac. Will plan on Cytotec induction.  Explained risks of induction with patient including hyperstimulation, fetal intolerance, failure to progress, Cesarean section. She is aware of probable serial induction (as well as agents used in induction process) due to unripe cervix and the possible problems with FTP due to fibroid in LUS.  2. CBC, T&S, Clrs, IVF 3. GBS PPX with penicillin.   4. Consents obtained. 5. Covid negative.  6. A POS/ RI/ VI 7. TDAP given AP 8. Breast/ LAM   Dalia Heading  11/10/2018 11:17 AM

## 2018-11-10 NOTE — Op Note (Signed)
Cesarean Section Operative Note    MACAELA MAUS   11/10/2018   Pre-operative Diagnosis:  1) Fetal intolerance to labor 2) intrauterine pregnancy at [redacted]w[redacted]d  3) Fetal growth restriction  Post-operative Diagnosis:  1) Fetal intolerance to labor 2) intrauterine pregnancy at [redacted]w[redacted]d  3) Fetal growth restriction   Procedure: Primary Low Transverse Cesarean Section via Pfannenstiel Incision with double layer uterine closure  Surgeon: Surgeon(s) and Role:    Will Bonnet, MD - Primary   Assistants: Dalia Heading, CNM; No other capable assistant available, in surgery requiring high level assistant.  Anesthesia: spinal   Findings:  1) normal appearing gravid uterus, fallopian tubes, and ovaries 2) viable female infant with APGARs 8 and 9 and weight 2,660 grams   Estimated Blood Loss: 550 mL  Total IV Fluids: 800 ml   Urine Output: 300 mL clear urine  Specimens: none  Complications: no complications  Disposition: PACU - hemodynamically stable.   Maternal Condition: stable   Baby condition / location:  Couplet care / Skin to Skin  Procedure Details:  The patient was seen in the Holding Room. The risks, benefits, complications, treatment options, and expected outcomes were discussed with the patient. The patient concurred with the proposed plan, giving informed consent. identified as Diana Stuart and the procedure verified as C-Section Delivery. A Time Out was held and the above information confirmed.   After induction of anesthesia, the patient was draped and prepped in the usual sterile manner. A Pfannenstiel incision was made and carried down through the subcutaneous tissue to the fascia. Fascial incision was made and extended transversely. The fascia was separated from the underlying rectus tissue superiorly and inferiorly. The peritoneum was identified and entered. Peritoneal incision was extended longitudinally. The bladder flap was bluntly and sharply freed  from the lower uterine segment. A low transverse uterine incision was made and the hysterotomy was extended with cranial-caudal tension. Delivered from cephalic presentation was a 2,660 gram Living newborn infant(s) or Female with Apgar scores of 8 at one minute and 9 at five minutes. Cord ph was not sent the umbilical cord was clamped and cut cord blood was not obtained for evaluation. The placenta was removed Intact and appeared normal. The uterine outline, tubes and ovaries appeared normal. The uterine incision was closed with running locked sutures of 0 Vicryl.  A second layer of the same suture was thrown in an imbricating fashion.  Hemostasis was assured.  The uterus was returned to the abdomen and the paracolic gutters were cleared of all clots and debris.  The rectus muscles were inspected and found to be hemostatic.  The On-Q catheter pumps were inserted in accordance with the manufacturer's recommendations.  The catheters were inserted approximately 4cm cephelad to the incision line, approximately 1cm apart, straddling the midline.  They were inserted to a depth of the 4th mark. They were positioned superficial to the rectus abdominus muscles and deep to the rectus fascia.    The fascia was then reapproximated with running sutures of 1-0 PDS, looped. To reduce tension on skin closure a 3-0 Vicryl running subcutaneous stitch was thrown.  The subcuticular closure was performed using 4-0 monocryl. The skin closure was reinforced using surgical skin glue.   The On-Q catheters were bolused with 5 mL of 0.5% marcaine plain for a total of 10 mL.  The catheters were affixed to the skin with surgical skin glue, steri-strips, and tegaderm.    Instrument, sponge, and needle counts were correct  prior the abdominal closure and were correct at the conclusion of the case.  The patient received Ancef 2 gram IV prior to skin incision (within 30 minutes). For VTE prophylaxis she was wearing SCDs throughout the case.   The assistant surgeon was an MD due to lack of availability of another Counselling psychologist.    Signed: Will Bonnet, MD 11/10/2018 5:55 PM

## 2018-11-11 ENCOUNTER — Encounter: Payer: Self-pay | Admitting: Obstetrics and Gynecology

## 2018-11-11 LAB — RPR: RPR Ser Ql: NONREACTIVE

## 2018-11-11 LAB — CBC
HCT: 30.4 % — ABNORMAL LOW (ref 36.0–46.0)
Hemoglobin: 9.7 g/dL — ABNORMAL LOW (ref 12.0–15.0)
MCH: 26.8 pg (ref 26.0–34.0)
MCHC: 31.9 g/dL (ref 30.0–36.0)
MCV: 84 fL (ref 80.0–100.0)
Platelets: 239 10*3/uL (ref 150–400)
RBC: 3.62 MIL/uL — ABNORMAL LOW (ref 3.87–5.11)
RDW: 14.8 % (ref 11.5–15.5)
WBC: 12.9 10*3/uL — ABNORMAL HIGH (ref 4.0–10.5)
nRBC: 0 % (ref 0.0–0.2)

## 2018-11-11 NOTE — Progress Notes (Addendum)
Obstetric Postpartum/PostOperative Daily Progress Note Subjective:  28 y.o. PO:[id number] post-operative day # 1 status post primary cesarean delivery.  She is not yet ambulating, is tolerating po, is not yet voiding spontaneously. Foley catheter was taken out this morning.  Her pain is well controlled on PO pain medications and with On Q pump. Her lochia is less than menses. She reports breastfeeding is going well.    Medications SCHEDULED MEDICATIONS  . acetaminophen  1,000 mg Oral Q6H  . ferrous sulfate  325 mg Oral BID WC  . ibuprofen  600 mg Oral Q6H  . prenatal multivitamin  1 tablet Oral Q1200  . senna-docusate  2 tablet Oral Q24H  . simethicone  80 mg Oral TID PC    MEDICATION INFUSIONS  . lactated ringers    . naLOXone [facility]) adult infusion for PRURITIS    . oxytocin 2.5 Units/hr (11/11/18 0048)    PRN MEDICATIONS  coconut oil, witch hazel-glycerin **AND** dibucaine, diphenhydrAMINE, ketorolac **OR** ketorolac, menthol-cetylpyridinium, nalbuphine **OR** nalbuphine, nalbuphine **OR** nalbuphine, naloxone **AND** sodium chloride flush, naLOXone (NARCAN) adult infusion for PRURITIS, ondansetron (ZOFRAN) IV, oxyCODONE-acetaminophen **OR** oxyCODONE-acetaminophen    Objective:   Vitals:   11/11/18 0600 11/11/18 0700 11/11/18 0816 11/11/18 0900  BP:   103/72   Pulse: 80 80 76 86  Resp:   18   Temp:   98.4 F (36.9 C)   TempSrc:   Oral   SpO2: 99% 99% 99% 100%  Weight:      Height:        Current Vital Signs 24h Vital Sign Ranges  T 98.4 F (36.9 C) Temp  Avg: 98.2 F (36.8 C)  Min: 97.8 F (36.6 C)  Max: 98.4 F (36.9 C)  BP 103/72 BP  Min: 77/50  Max: 131/75  HR 86 Pulse  Avg: 73.3  Min: 59  Max: 98  RR 18 Resp  Avg: 17.6  Min: 11  Max: 23  SaO2 100 % Room Air SpO2  Avg: 99.6 %  Min: 98 %  Max: 100 %       24 Hour I/O Current Shift I/O  Time Ins Outs 09/10 0701 - 09/11 0700 In: 1850 [I.V.:900] Out: 1465 [Urine:850] 09/11 0701 - 09/11 1900 In: 240 [P.O.:240] Out:  -   General: NAD Pulmonary: no increased work of breathing Abdomen: non-distended, non-tender, fundus firm at level of umbilicus Inc: Clean/dry/intact, sero/sang drainage at On Q pump site Extremities: no edema, no erythema, no tenderness  Labs:  Recent Labs  Lab 11/10/18 0913 11/11/18 0454  WBC 9.8 12.9*  HGB 11.5* 9.7*  HCT 35.7* 30.4*  PLT 291 239     Assessment:   28 y.o. G3P1021 postoperative day # 1 status post primary cesarean section  Plan:  1) Acute blood loss anemia - hemodynamically stable and asymptomatic - po ferrous sulfate  2) A POS / Rubella 4.90 (02/28 1136)/ Varicella Immune  3) TDAP status: given antepartum  4) breast /Contraception = no planned method at this time  5) Disposition: continue routine post c/section care   Rod Can, CNM 11/11/2018 10:01 AM

## 2018-11-11 NOTE — Anesthesia Postprocedure Evaluation (Signed)
Anesthesia Post Note  Patient: Diana Stuart  Procedure(s) Performed: PRIMARY CESAREAN SECTION (N/A Abdomen)  Patient location during evaluation: Mother Baby Anesthesia Type: Spinal Level of consciousness: awake and alert and oriented Pain management: pain level controlled Vital Signs Assessment: post-procedure vital signs reviewed and stable Respiratory status: respiratory function stable Cardiovascular status: stable Postop Assessment: no headache, no backache, spinal receding, patient able to bend at knees, no apparent nausea or vomiting, able to ambulate and adequate PO intake Anesthetic complications: no     Last Vitals:  Vitals:   11/11/18 0600 11/11/18 0700  BP:    Pulse: 80 80  Resp:    Temp:    SpO2: 99% 99%    Last Pain:  Vitals:   11/11/18 0301  TempSrc: Oral  PainSc:                  [name]

## 2018-11-11 NOTE — Lactation Note (Signed)
This note was copied from a baby's chart. Lactation Consultation Note  Patient Name: Diana Stuart [id number] Date: 11/11/2018 Reason for consult: Initial assessment;Primapara Has not voided since birth  Maternal Data Formula Feeding for Exclusion: No Has patient been taught Hand Expression?: Yes Does the patient have breastfeeding experience prior to this delivery?: No  Feeding Feeding Type: Breast Fed Needs stimulation to encourage suckling, falls asleep easily at breast, unable to latch to right breast, not opening mouth, gaggy LATCH Score Latch: Grasps breast easily, tongue down, lips flanged, rhythmical sucking.  Audible Swallowing: A few with stimulation  Type of Nipple: Everted at rest and after stimulation  Comfort (Breast/Nipple): Soft / non-tender  Hold (Positioning): Assistance needed to correctly position infant at breast and maintain latch.  LATCH Score: 8  Interventions Interventions: Breast feeding basics reviewed;Assisted with latch;Skin to skin;Breast massage;Hand express;Adjust position;Support pillows  Lactation Tools Discussed/Used WIC Program: Yes Given Medela manual breast pump kit, mom to make appt for next week with WIC in [place]. Maintain skin to skin with baby to observe for feeding cues to have more frequent feeds to increase voiding    Consult Status Consult Status: Follow-up Date: 11/12/18 Follow-up type: In-patient    Ferol Luz 11/11/2018, 5:51 PM

## 2018-11-12 MED ORDER — ACETAMINOPHEN 325 MG PO TABS: 650.0000 mg | ORAL_TABLET | Freq: Four times a day (QID) | ORAL | 2 refills | Status: AC | PRN

## 2018-11-12 MED ORDER — IBUPROFEN 600 MG PO TABS: 600.0000 mg | ORAL_TABLET | Freq: Four times a day (QID) | ORAL | 1 refills | Status: DC | PRN

## 2018-11-12 MED ORDER — OXYCODONE HCL 5 MG PO TABS: 5.0000 mg | ORAL_TABLET | Freq: Four times a day (QID) | ORAL | 0 refills | Status: AC | PRN

## 2018-11-12 NOTE — Discharge Instructions (Signed)
Keep incision clean and dry.  Call your doctor for incision concerns including redness, swelling, bleeding or drainage, or if begins to come apart.  No strenuous activity or heavy lifting for 6 weeks.  No intercourse, tampons, or douching for 6 weeks.  No tub baths- showers only.  No driving for 2 weeks or while taking pain medications.  Call your doctor for increased pain or vaginal bleeding, temperature above 100.4, depression, or concerns.

## 2018-11-12 NOTE — Progress Notes (Signed)
Discharge instructions provided.  Pt and sig other verbalize understanding of all instructions and follow-up care.  Incision hygiene kit given.  Pt discharged to home with infant at 1644 on 11/12/18 via wheelchair by RN. Reed Breech, RN 11/12/2018

## 2018-11-12 NOTE — Progress Notes (Signed)
POD #2 LTCS for FITL Subjective:   Feels well. Would like to be discharged. Tolerating a regular diet. Has been passing flatus and had a BM today. Voiding without difficulty. Bleeding slowing. No lightheadedness when OOB. Breast feeding going well.  Objective:  Blood pressure 123/88, pulse 95, temperature 98 F (36.7 C), resp. rate 18, height 5' (1.524 m), weight 72.8 kg, last menstrual period 02/17/2018, SpO2 100 %, unknown if currently breastfeeding.  General: BF in NAD Heart: RRR without murmur Pulmonary: no increased work of breathing/ CTAB Abdomen: non-distended, non-tender, fundus firm at level of umbilicus Incision: Honeycomb dressing C&D&I. ON Q dressing with moderate amount clear to pink serosanginous drainage Extremities: no edema, no erythema, no tenderness  Results for orders placed or performed during the hospital encounter of 11/10/18 (from the past 72 hour(s))  CBC     Status: Abnormal   Collection Time: 11/10/18  9:13 AM  Result Value Ref Range   WBC 9.8 4.0 - 10.5 K/uL   RBC 4.26 3.87 - 5.11 MIL/uL   Hemoglobin 11.5 (L) 12.0 - 15.0 g/dL   HCT 35.7 (L) 36.0 - 46.0 %   MCV 83.8 80.0 - 100.0 fL   MCH 27.0 26.0 - 34.0 pg   MCHC 32.2 30.0 - 36.0 g/dL   RDW 15.0 11.5 - 15.5 %   Platelets 291 150 - 400 K/uL   nRBC 0.0 0.0 - 0.2 %    Comment: Performed at [facility], [address] [place], [place] [address]  RPR     Status: None   Collection Time: 11/10/18  9:13 AM  Result Value Ref Range   RPR Ser Ql NON REACTIVE NON REACTIVE    Comment: Performed at [facility], [place] [address], [place], [place] [address]  Type and screen     Status: None   Collection Time: 11/10/18 11:13 AM  Result Value Ref Range   ABO/RH(D) A POS    Antibody Screen NEG    Sample Expiration      11/13/2018,2359 Performed at [facility], [address] [place], [place] [address]   CBC     Status: Abnormal   Collection Time: 11/11/18  4:54 AM  Result Value Ref  Range   WBC 12.9 (H) 4.0 - 10.5 K/uL   RBC 3.62 (L) 3.87 - 5.11 MIL/uL   Hemoglobin 9.7 (L) 12.0 - 15.0 g/dL   HCT 30.4 (L) 36.0 - 46.0 %   MCV 84.0 80.0 - 100.0 fL   MCH 26.8 26.0 - 34.0 pg   MCHC 31.9 30.0 - 36.0 g/dL   RDW 14.8 11.5 - 15.5 %   Platelets 239 150 - 400 K/uL   nRBC 0.0 0.0 - 0.2 %    Comment: Performed at [facility], [address], [place], [place] [address]     Assessment:   28 y.o. [phone] postoperativeday # 2  Continue postoperative/ postpartum care  Discharge later today if baby discharged   Plan:  1) Acute blood loss anemia - hemodynamically stable and asymptomatic - po vitamins with iron  2) A POS/ RI/ VI  3) TDAP given AP  4) Breast/Contraception-LAM. Discussed recommendations to delay pregnancy x 18 mos  5)Discharge today if baby discharged.  Dalia Heading, CNM

## 2018-11-19 NOTE — Progress Notes (Signed)
HROB/ NST at 39 weeks: Fetal growth restriction. Baby very active this Am. Rare contraction. No vaginal bleeding or leakage of fluid Scheduled for IOL tomorrow. Wants to breast feed AFI13.3 cm and Dopplers WNL NST: difficult tracing to interpret due to very active baby. Possible decelerations in the fetal  heart rate? Baseline 140 with decelerations to 120s To L&D for further monitoring Dalia Heading, CNM

## 2018-12-22 ENCOUNTER — Ambulatory Visit (INDEPENDENT_AMBULATORY_CARE_PROVIDER_SITE_OTHER): Payer: Medicaid Other | Admitting: Obstetrics and Gynecology

## 2018-12-22 ENCOUNTER — Other Ambulatory Visit: Payer: Self-pay

## 2018-12-22 ENCOUNTER — Encounter: Payer: Self-pay | Admitting: Obstetrics and Gynecology

## 2018-12-22 DIAGNOSIS — Z1389 Encounter for screening for other disorder: Secondary | ICD-10-CM | POA: Diagnosis not present

## 2018-12-22 DIAGNOSIS — F53 Postpartum depression: Secondary | ICD-10-CM

## 2018-12-22 DIAGNOSIS — O99345 Other mental disorders complicating the puerperium: Secondary | ICD-10-CM

## 2018-12-22 MED ORDER — SERTRALINE HCL 50 MG PO TABS: 50.0000 mg | ORAL_TABLET | Freq: Every day | ORAL | 0 refills | Status: DC

## 2018-12-22 NOTE — Patient Instructions (Signed)
[facility]: [id number] [place], [place] [address]  To schedule an appointment: [phone] option #3 [email or web address]  ------------------------------------------------------------  Therapists/Counselors/Psychologists  Karen [place], [place], Delfin Gant and [place]  [phone]  [address]  [place], [place] [address]   Peggye Ley, [place] [phone] [place], [place] [address]  Lady Deutscher, [place]  [phone]  [address], [id number]  [place], [place] [address]   Bettey Mare, [place] [phone] [address] [place], [place] [address]  Dimas Aguas, LMFT  [phone]  [address]  [place], [place] [address]   Miguel Dibble [phone] [address] [place], [place] [address]  Rhona Raider  [phone]  [address]  [place], [place] [address]   Sena Hitch [phone] [address] [place], [place] [address]  Lestine Box, [place]  [phone]  [address]  [place], [place] [address]   Marjie Skiff, [place] [phone] [address] [place], [place] [address]  Einar Gip  [phone]  [address] [place], [place] [address]   [place] [phone] [email or web address]  Sation Konchella  [phone]  [address] [place], [place] [address]   [place] [phone] [email or web address]

## 2018-12-22 NOTE — Progress Notes (Signed)
Postpartum Visit  Chief Complaint:  Chief Complaint  Patient presents with  . Postpartum Care    History of Present Illness: Patient is a 28 y.o. [id number] presents for postpartum visit.  Date of delivery: 11/10/2018 Type of delivery: C-Section Pregnancy or labor problems:  Anemia Any problems since the delivery:  no  Newborn Details:  SINGLETON :  1. Baby's name: Javion. Birth weight: 5.14lb Maternal Details:  Breast Feeding: yes Post partum depression/anxiety noted:  A little anxiety Edinburgh Post-Partum Depression Score:  11  Date of last PAP: 04/29/2018  NORMAL  28 y.o. [id number] presents for postpartum visit some depression and anxiety. She believes it is affecting her to a large extent.  She denies SI/HI (see EPDS score sheet).    Past Medical History:  Diagnosis Date  . Miscarriage   . Seizure [facility])     Past Surgical History:  Procedure Laterality Date  . CESAREAN SECTION N/A 11/10/2018   Procedure: PRIMARY CESAREAN SECTION;  Surgeon: Will Bonnet, MD;  Location: [facility] ORS;  Service: Obstetrics;  Laterality: N/A;  Baby Boy Born @ 1703 Apgars: 8/9 Weight: 8  lbs 14 ozs    Prior to Admission medications   Medication Sig Start Date End Date Taking? Authorizing Provider  acetaminophen (TYLENOL) 325 MG tablet Take 2 tablets (650 mg total) by mouth every 6 (six) hours as needed for mild pain or moderate pain. 11/12/18 11/12/19  Dalia Heading, CNM  ibuprofen (ADVIL) 600 MG tablet Take 1 tablet (600 mg total) by mouth every 6 (six) hours as needed for mild pain, moderate pain or cramping. 11/12/18   Dalia Heading, CNM  Prenatal Vit-Fe Fumarate-FA (MULTIVITAMIN-PRENATAL) 27-0.8 MG TABS tablet Take 1 tablet by mouth daily at 12 noon.    [provider]    Allergies  Allergen Reactions  . Grape Flavor [Grape (Artificial) Flavor] Hives     Social History   Socioeconomic History  . Marital status: Married    Spouse name: Not on file  . Number of  children: Not on file  . Years of education: Not on file  . Highest education level: Not on file  Occupational History  . Not on file  Social Needs  . Financial resource strain: Not hard at all  . Food insecurity    Worry: Never true    Inability: Never true  . Transportation needs    Medical: No    Non-medical: No  Tobacco Use  . Smoking status: Former Smoker    Types: Cigarettes  . Smokeless tobacco: Never Used  Substance and Sexual Activity  . Alcohol use: Yes  . Drug use: No  . Sexual activity: Yes    Birth control/protection: None  Lifestyle  . Physical activity    Days per week: 0 days    Minutes per session: 0 min  . Stress: Not at all  Relationships  . Social connections    Talks on phone: More than three times a week    Gets together: More than three times a week    Attends religious service: Never    Active member of club or organization: No    Attends meetings of clubs or organizations: Never    Relationship status: Married  . Intimate partner violence    Fear of current or ex partner: No    Emotionally abused: No    Physically abused: No    Forced sexual activity: Not on file  Other Topics Concern  . Not on file  Social History Narrative  . Not on file    Family History  Problem Relation Age of Onset  . Lung cancer Maternal Grandfather 60  . Breast cancer Paternal Grandmother 21    Review of Systems  Constitutional: Negative.   HENT: Negative.   Eyes: Negative.   Respiratory: Negative.   Cardiovascular: Negative.   Gastrointestinal: Negative.   Genitourinary: Negative.   Musculoskeletal: Negative.   Skin: Negative.   Neurological: Negative.   Psychiatric/Behavioral: Positive for depression. Negative for hallucinations, memory loss, substance abuse and suicidal ideas. The patient is nervous/anxious. The patient does not have insomnia.      Physical Exam BP 122/74   Ht 5' (1.524 m)   Wt 140 lb (63.5 kg)   BMI 27.34 kg/m   Physical Exam  Constitutional:      General: She is not in acute distress.    Appearance: Normal appearance. She is well-developed.  Genitourinary:     Pelvic exam was performed with patient in the lithotomy position.     Vulva, inguinal canal, urethra, bladder, vagina, uterus, right adnexa and left adnexa normal.     No posterior fourchette tenderness, injury or lesion present.     No cervical friability, lesion, bleeding or polyp.  HENT:     Head: Normocephalic and atraumatic.  Eyes:     General: No scleral icterus.    Conjunctiva/sclera: Conjunctivae normal.  Neck:     Musculoskeletal: Normal range of motion and neck supple.  Cardiovascular:     Rate and Rhythm: Normal rate and regular rhythm.     Heart sounds: No murmur. No friction rub. No gallop.   Pulmonary:     Effort: Pulmonary effort is normal. No respiratory distress.     Breath sounds: Normal breath sounds. No wheezing or rales.  Abdominal:     General: Bowel sounds are normal. There is no distension.     Palpations: Abdomen is soft. There is no mass.     Tenderness: There is no abdominal tenderness. There is no guarding or rebound.     Comments: without erythema, induration, warmth, and tenderness. It is clean, dry, and intact.    Musculoskeletal: Normal range of motion.  Neurological:     General: No focal deficit present.     Mental Status: She is alert and oriented to person, place, and time.     Cranial Nerves: No cranial nerve deficit.  Skin:    General: Skin is warm and dry.     Findings: No erythema.  Psychiatric:        Mood and Affect: Mood normal.        Behavior: Behavior normal.        Judgment: Judgment normal.      Female Chaperone present during breast and/or pelvic exam.  Assessment: 28 y.o. [id number] presenting for 6 week postpartum visit  Plan: Problem List Items Addressed This Visit    None    Visit Diagnoses    Postpartum care and examination    -  Primary   Relevant Medications   sertraline  (ZOLOFT) 50 MG tablet   Postpartum depression       Relevant Medications   sertraline (ZOLOFT) 50 MG tablet      1) Contraception Education given. Elects to do nothing for the moment .  2)  Pap - ASCCP guidelines and rational discussed.  Patient opts for routine screening interval  3) Patient underwent screening for postpartum depression with some concerns noted. Zoloft started. She  was also provided with a list of counselors in the area. Precautions for SI/HI given and instructions to go to the nearest ER should SI/HI develop. Discussed use of zoloft and expectations for its use.   Return in about 4 weeks (around 01/19/2019) for Medication follow up.   Prentice Docker, MD 12/22/2018 10:42 AM

## 2018-12-29 ENCOUNTER — Other Ambulatory Visit: Payer: Self-pay | Admitting: Certified Nurse Midwife

## 2019-01-19 ENCOUNTER — Other Ambulatory Visit: Payer: Self-pay

## 2019-01-19 ENCOUNTER — Ambulatory Visit (INDEPENDENT_AMBULATORY_CARE_PROVIDER_SITE_OTHER): Payer: Medicaid Other | Admitting: Obstetrics and Gynecology

## 2019-01-19 ENCOUNTER — Encounter: Payer: Self-pay | Admitting: Obstetrics and Gynecology

## 2019-01-19 VITALS — BP 110/70 | Ht 60.0 in | Wt 142.0 lb

## 2019-01-19 DIAGNOSIS — O99345 Other mental disorders complicating the puerperium: Secondary | ICD-10-CM

## 2019-01-19 DIAGNOSIS — F53 Postpartum depression: Secondary | ICD-10-CM | POA: Diagnosis not present

## 2019-01-19 MED ORDER — SERTRALINE HCL 50 MG PO TABS: 50.0000 mg | ORAL_TABLET | Freq: Every day | ORAL | 0 refills | Status: DC

## 2019-01-19 NOTE — Progress Notes (Signed)
Obstetrics & Gynecology Office Visit   Chief Complaint  Patient presents with  . Follow-up    Feeling better, feels like she could go off of medication    History of Present Illness: 28 y.o. [id number] female who presents in follow-up from postpartum depression after starting Zoloft 50 mg about 1 month ago.  She states she is doing much better and is tolerating the medication well.  She states that she has adapted to her new situation and believes that she could conceivably come off the medication.  She denies current SI and HI.   Past Medical History:  Diagnosis Date  . Miscarriage   . Seizure [facility])     Past Surgical History:  Procedure Laterality Date  . CESAREAN SECTION N/A 11/10/2018   Procedure: PRIMARY CESAREAN SECTION;  Surgeon: Will Bonnet, MD;  Location: [facility] ORS;  Service: Obstetrics;  Laterality: N/A;  Baby Boy Born @ 1703 Apgars: 8/9 Weight: 8  lbs 14 ozs    Gynecologic History: No LMP recorded.  Obstetric History: [id number]  Family History  Problem Relation Age of Onset  . Lung cancer Maternal Grandfather 60  . Breast cancer Paternal Grandmother 72    Social History   Socioeconomic History  . Marital status: Married    Spouse name: Not on file  . Number of children: Not on file  . Years of education: Not on file  . Highest education level: Not on file  Occupational History  . Not on file  Social Needs  . Financial resource strain: Not hard at all  . Food insecurity    Worry: Never true    Inability: Never true  . Transportation needs    Medical: No    Non-medical: No  Tobacco Use  . Smoking status: Former Smoker    Types: Cigarettes  . Smokeless tobacco: Never Used  Substance and Sexual Activity  . Alcohol use: Yes  . Drug use: No  . Sexual activity: Yes    Birth control/protection: None  Lifestyle  . Physical activity    Days per week: 0 days    Minutes per session: 0 min  . Stress: Not at all  Relationships  . Social connections    Talks on phone: More than three times a week    Gets together: More than three times a week    Attends religious service: Never    Active member of club or organization: No    Attends meetings of clubs or organizations: Never    Relationship status: Married  . Intimate partner violence    Fear of current or ex partner: No    Emotionally abused: No    Physically abused: No    Forced sexual activity: Not on file  Other Topics Concern  . Not on file  Social History Narrative  . Not on file    Allergies  Allergen Reactions  . Grape Flavor [Grape (Artificial) Flavor] Hives    Prior to Admission medications   Medication Sig Start Date End Date Taking? Authorizing Provider  Prenatal Vit-Fe Fumarate-FA (MULTIVITAMIN-PRENATAL) 27-0.8 MG TABS tablet Take 1 tablet by mouth daily at 12 noon.   Yes [provider]  sertraline (ZOLOFT) 50 MG tablet Take 1 tablet (50 mg total) by mouth daily. 12/22/18  Yes Will Bonnet, MD  acetaminophen (TYLENOL) 325 MG tablet Take 2 tablets (650 mg total) by mouth every 6 (six) hours as needed for mild pain or moderate pain. Patient not taking: Reported on 01/19/2019 11/12/18  11/12/19  Dalia Heading, CNM  ibuprofen (ADVIL) 600 MG tablet Take 1 tablet (600 mg total) by mouth every 6 (six) hours as needed for mild pain, moderate pain or cramping. Patient not taking: Reported on 01/19/2019 11/12/18   Dalia Heading, CNM    Review of Systems  Constitutional: Negative.   HENT: Negative.   Eyes: Negative.   Respiratory: Negative.   Cardiovascular: Negative.   Gastrointestinal: Negative.   Genitourinary: Negative.   Musculoskeletal: Negative.   Skin: Negative.   Neurological: Negative.   Psychiatric/Behavioral: Negative.      Physical Exam BP 110/70   Ht 5' (1.524 m)   Wt 142 lb (64.4 kg)   Breastfeeding No   BMI 27.73 kg/m  No LMP recorded. Physical Exam Constitutional:      General: She is not in acute distress.     Appearance: Normal appearance.  HENT:     Head: Normocephalic and atraumatic.  Eyes:     General: No scleral icterus.    Conjunctiva/sclera: Conjunctivae normal.  Neurological:     General: No focal deficit present.     Mental Status: She is alert and oriented to person, place, and time.     Cranial Nerves: No cranial nerve deficit.  Psychiatric:        Mood and Affect: Mood normal.        Behavior: Behavior normal.        Judgment: Judgment normal.    EPDS: 3  Assessment: 28 y.o. [id number] female here for  1. Postpartum depression      Plan: Problem List Items Addressed This Visit    None    Visit Diagnoses    Postpartum depression    -  Primary     Patient is much improved since her last visit.  We discussed either stopping the medication or decreasing her dose.  Her preference would be to decrease her dose by half.  I will write her for a full dose at this time in case she has a return of symptoms with halving the dose.  If she continues to do well off the medication, then no need for further follow-up.  If she is still requiring the medication when she is nearly out, she should make a follow-up appointment for planning purposes for future treatment and assessment.  15 minutes spent in face to face discussion with > 50% spent in counseling,management, and coordination of care of her postpartum depression.   Prentice Docker, MD 01/19/2019 11:02 AM

## 2020-04-29 ENCOUNTER — Telehealth: Payer: Self-pay | Admitting: *Deleted

## 2020-04-29 NOTE — Telephone Encounter (Signed)
Left voice message for patient to return nurse call regarding prenatal care at  [facility].  Derl Barrow, RN

## 2020-04-30 NOTE — Telephone Encounter (Signed)
Spoke with patient regarding prenatal care at [facility]. Patient stated she has already made an appointment with another practice at the end of the month. She is in school right now and she does not get out of class until 5. Advised patient if anything changes, she can always give [place] a call back.  Derl Barrow, RN

## 2020-05-01 ENCOUNTER — Encounter: Payer: Medicaid Other | Admitting: Obstetrics and Gynecology

## 2020-05-09 IMAGING — US OBSTETRIC 14+ WK ULTRASOUND
1 series · 13 of 28 positions shown · non-contrast
Comparison: none

CLINICAL DATA: Current assigned gestational age of 20 weeks 0 days.
Evaluate fetal anatomy and growth.

EXAM:
OBSTETRICAL ULTRASOUND >14 WKS

[Series 1: obstetric 14+ wk ultrasound · 0.19mm/px · 13 of 224 slices shown]
[im 9/224]
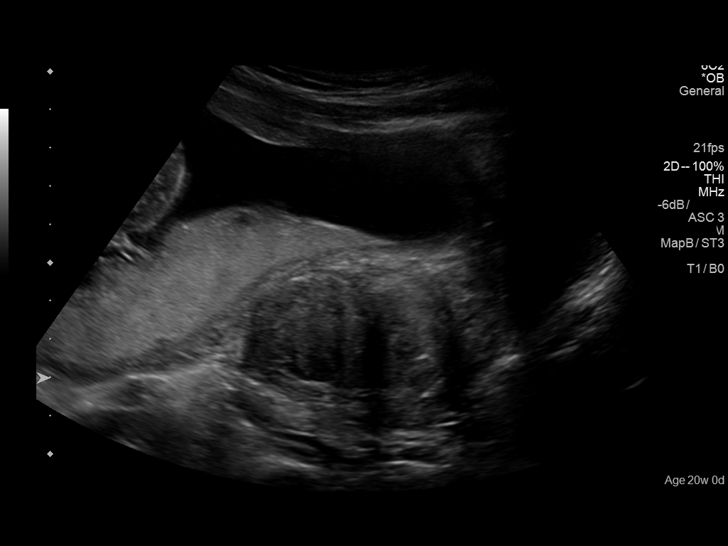
[im 25/224]
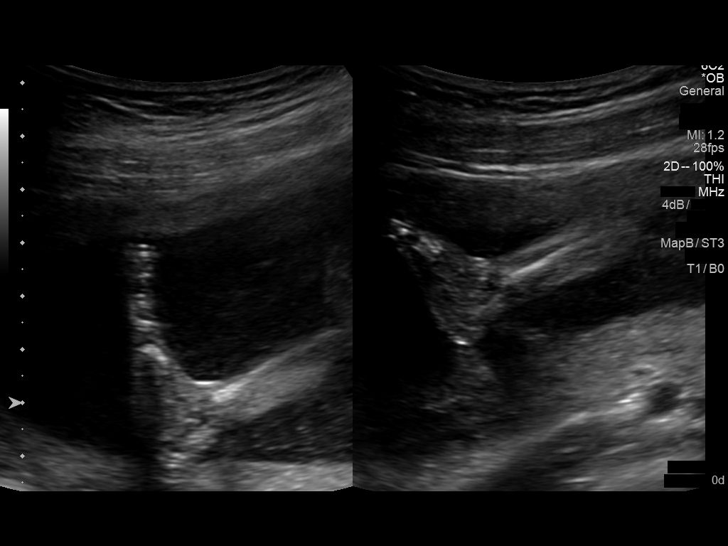
[im 42/224]
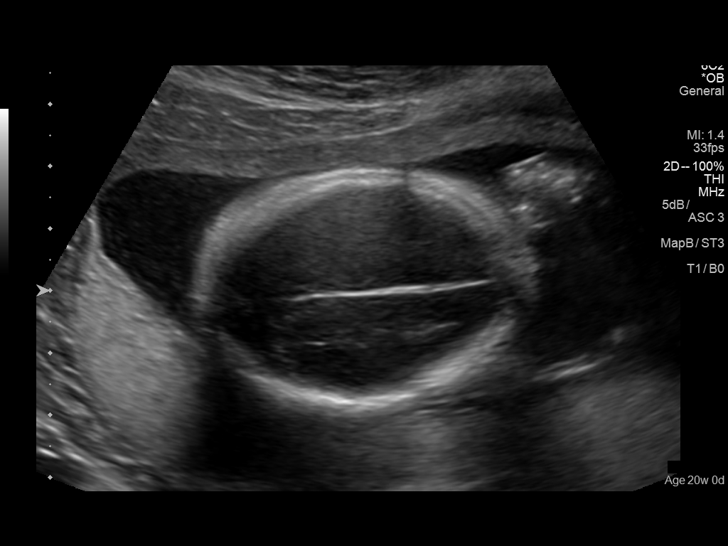
[im 58/224]
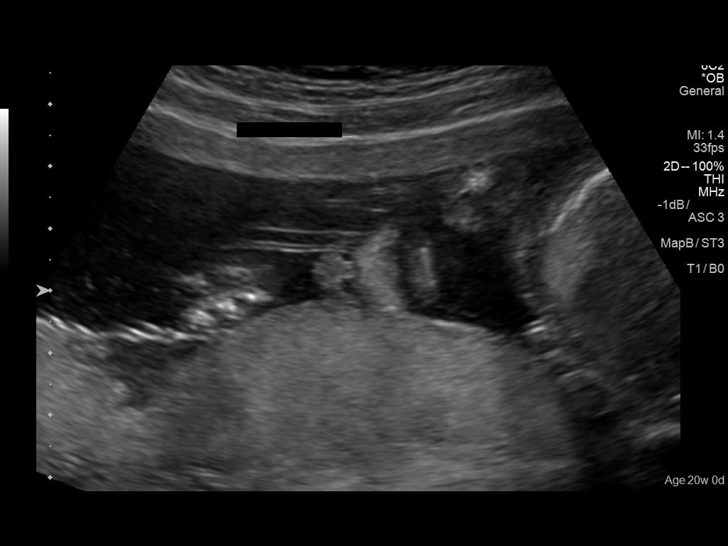
[im 75/224]
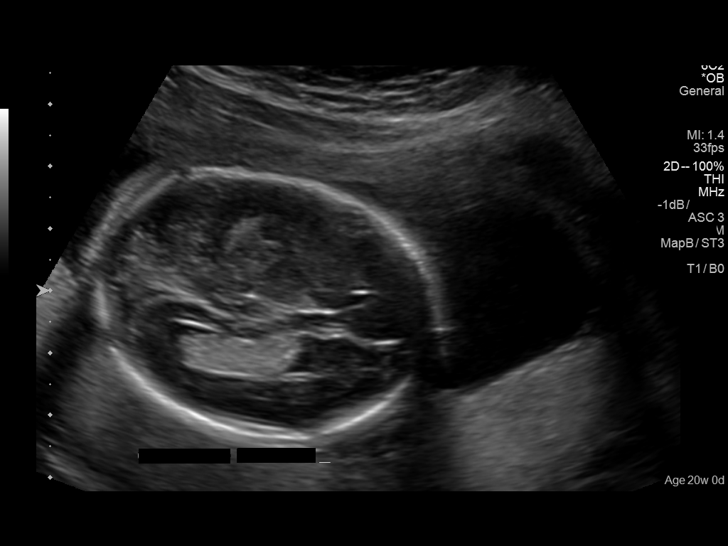
[im 91/224]
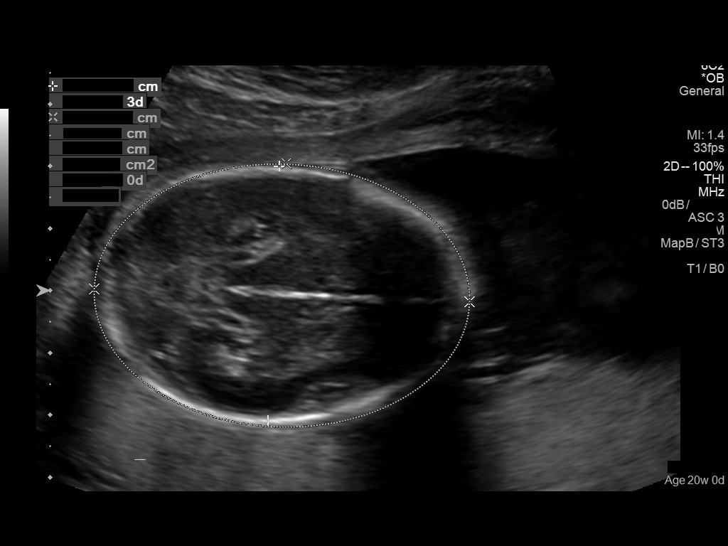
[im 116/224]
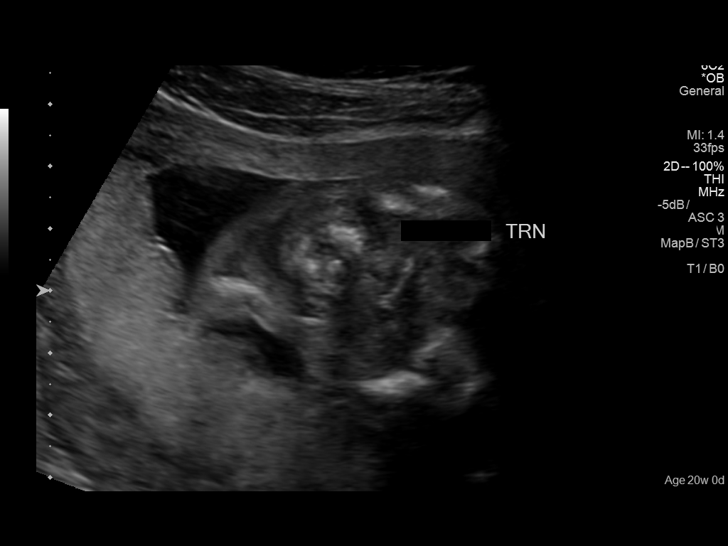
[im 133/224]
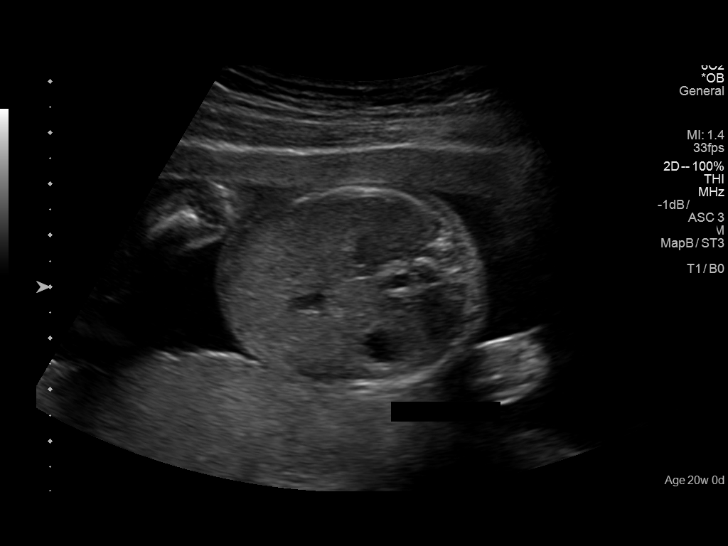
[im 149/224]
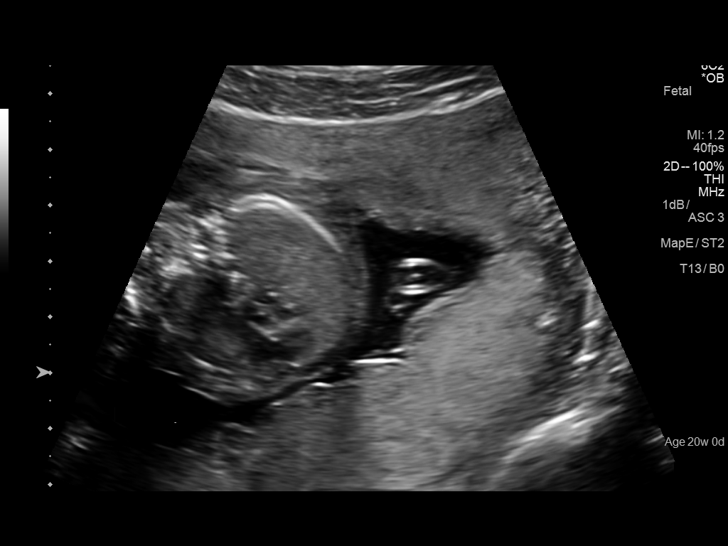
[im 166/224]
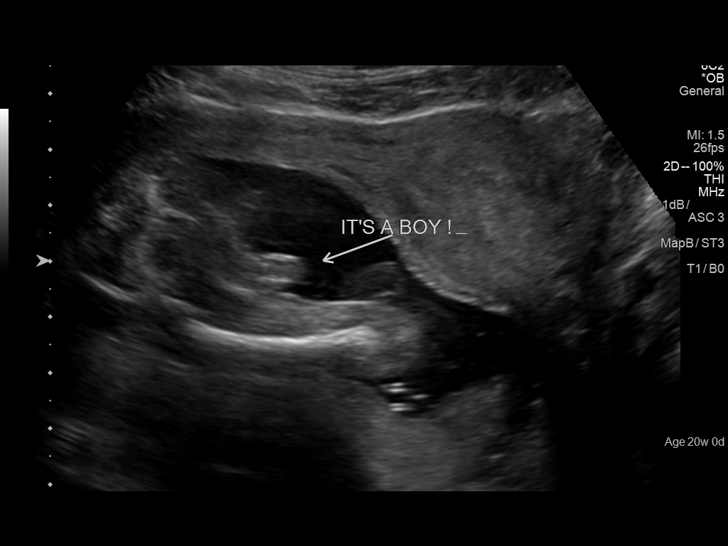
[im 182/224]
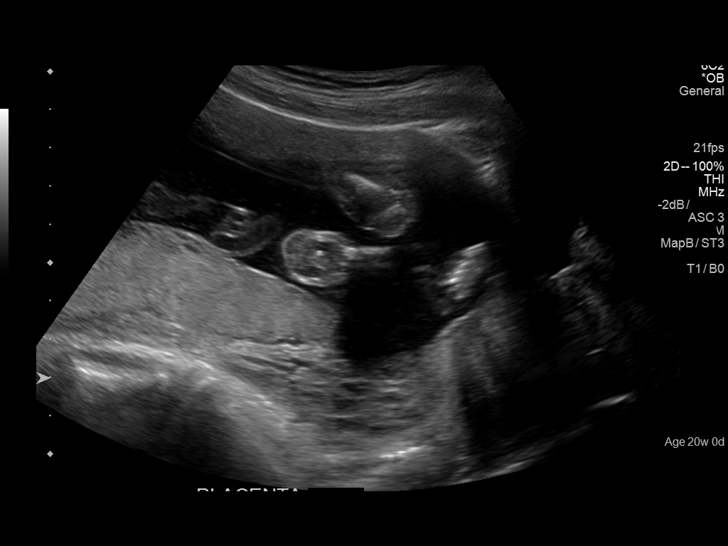
[im 199/224]
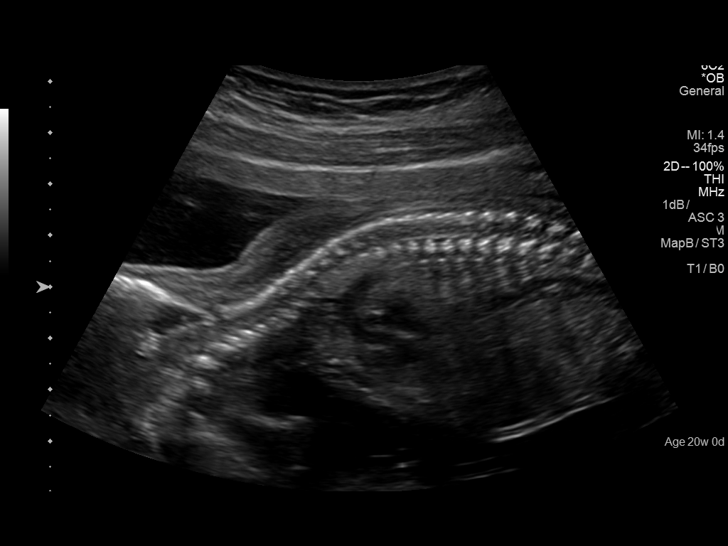
[im 215/224]
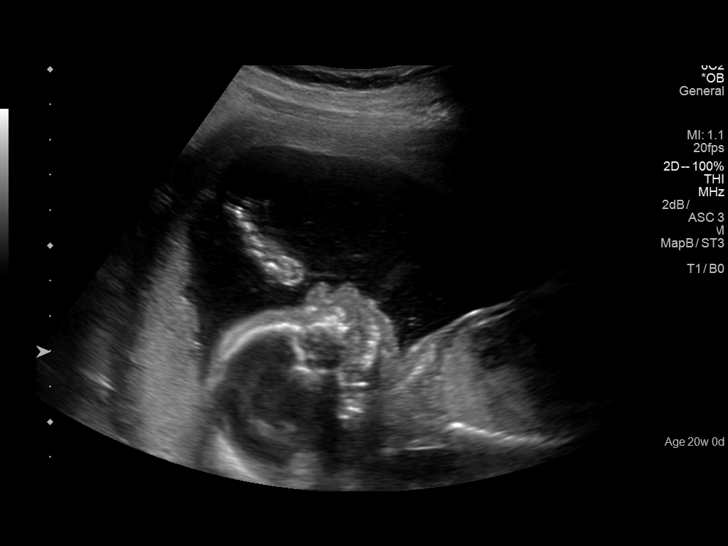

[13 of 28 positions shown; findings below may reference images not displayed]

FINDINGS: Number of Fetuses: 1

Heart Rate:  155 bpm

Movement: Yes

Presentation: Breech

Previa: No

Placental Location: Posterior

Amniotic Fluid (Subjective): Within normal limits

Amniotic Fluid (Objective):

Vertical pocket = 4.0cm

FETAL BIOMETRY

BPD: 4.1cm 18w 3d

HC:   16.3cm 19w 0d

AC:   14.7cm 20w 0d

FL:   3.2cm 19w 6d

Current Mean GA: 19w 3d US EDC: 11/20/2018

Assigned GA:  20w 0d Assigned EDC: 11/16/2018

FETAL ANATOMY

Lateral Ventricles: Appears normal

Thalami/CSP: Appears normal

Posterior Fossa:  Appears normal

Nuchal Region: Appears normal   NFT= 2.6 mm

Upper Lip: Appears normal

Spine: Appears normal

4 Chamber Heart on Left: Appears normal

LVOT: Appears normal

RVOT: Appears normal

Stomach on Left: Appears normal

3 Vessel Cord: Appears normal

Cord Insertion site: Appears normal

Kidneys: Appears normal

Bladder: Appears normal

Extremities: Appears normal

Sex: Male

Technically difficult due to: Fetal position

Maternal Findings:

Cervix: 4.0 cm TA. A 5.5 cm fibroid is seen in the right posterior
lower uterine segment.
IMPRESSION: Current assigned gestational age of 20 weeks 0 days. Appropriate
fetal growth.

Normal visualized fetal anatomy.  No anomalies identified.

5.5 cm fibroid in right posterior lower uterine segment.

## 2020-05-29 ENCOUNTER — Encounter: Payer: Self-pay | Admitting: Obstetrics and Gynecology

## 2020-05-29 ENCOUNTER — Other Ambulatory Visit: Payer: Self-pay

## 2020-05-29 ENCOUNTER — Ambulatory Visit (INDEPENDENT_AMBULATORY_CARE_PROVIDER_SITE_OTHER): Payer: Medicaid Other | Admitting: Obstetrics and Gynecology

## 2020-05-29 VITALS — BP 120/72 | HR 108 | Wt 127.0 lb

## 2020-05-29 DIAGNOSIS — Z3480 Encounter for supervision of other normal pregnancy, unspecified trimester: Secondary | ICD-10-CM | POA: Insufficient documentation

## 2020-05-29 DIAGNOSIS — Z3A12 12 weeks gestation of pregnancy: Secondary | ICD-10-CM

## 2020-05-29 DIAGNOSIS — Z369 Encounter for antenatal screening, unspecified: Secondary | ICD-10-CM

## 2020-05-29 DIAGNOSIS — D259 Leiomyoma of uterus, unspecified: Secondary | ICD-10-CM

## 2020-05-29 DIAGNOSIS — O34219 Maternal care for unspecified type scar from previous cesarean delivery: Secondary | ICD-10-CM | POA: Insufficient documentation

## 2020-05-29 DIAGNOSIS — Z113 Encounter for screening for infections with a predominantly sexual mode of transmission: Secondary | ICD-10-CM

## 2020-05-29 DIAGNOSIS — O3410 Maternal care for benign tumor of corpus uteri, unspecified trimester: Secondary | ICD-10-CM

## 2020-05-29 DIAGNOSIS — Z8759 Personal history of other complications of pregnancy, childbirth and the puerperium: Secondary | ICD-10-CM

## 2020-05-29 LAB — OB RESULTS CONSOLE VARICELLA ZOSTER ANTIBODY, IGG: Varicella: IMMUNE

## 2020-05-29 NOTE — Progress Notes (Signed)
New Obstetric Patient H&P    Chief Complaint: "Desires prenatal care"   History of Present Illness: Patient is a 30 y.o. [id number] Not Hispanic or Latino female, presents with amenorrhea and positive home pregnancy test Patient's last menstrual period was 03/05/2020 (exact date). and based on her  LMP, her EDD is Estimated Date of Delivery: 12/10/20 and her EGA is [redacted]w[redacted]d. Cycles are regular monthly. Her last pap smear was 04/29/2018 and was no abnormalities.    She had a urine pregnancy test which was positive 4 week(s)  ago. Her last menstrual period was normal. Since her LMP she claims she has experienced some nausea which has been improving, fatigue, breast tendereness. She denies vaginal bleeding. Her past medical history is contibutory for history of uterine fibroid. Her prior pregnancies are notable for IUGR, prior cesarean section for fetal intolerance to labor  Since her LMP, she admits to the use of tobacco products  no There are cats in the home in the home  no She admits close contact with children on a regular basis  yes  She has had chicken pox in the past yes She has had Tuberculosis exposures, symptoms, or previously tested positive for TB   no Current or past history of domestic violence. no  Genetic Screening/Teratology Counseling: (Includes patient, baby's father, or anyone in either family with:)   35. Patient's age >/= 35 [facility]  no 2. Thalassemia ([place], [place], [place], or Asian background): MCV<80  no 3. Neural tube defect (meningomyelocele, spina bifida, anencephaly)  no 4. Congenital heart defect  no  5. Down syndrome  no 6. Tay-Sachs (Jewish, [place])  no 7. Canavan's Disease  no 8. Sickle cell disease or trait (African)  no  9. Hemophilia or other blood disorders  no  10. Muscular dystrophy  no  11. Cystic fibrosis  no  12. Huntington's Chorea  no  13. Mental retardation/autism  no 14. Other inherited genetic or chromosomal disorder  no 15.  Maternal metabolic disorder (DM, PKU, etc)  no 16. Patient or FOB with a child with a birth defect not listed above no  16a. Patient or FOB with a birth defect themselves no 17. Recurrent pregnancy loss, or stillbirth  no  18. Any medications since LMP other than prenatal vitamins (include vitamins, supplements, OTC meds, drugs, alcohol)  no 19. Any other genetic/environmental exposure to discuss  no  Infection History:   1. Lives with someone with TB or TB exposed  no  2. Patient or partner has history of genital herpes  no 3. Rash or viral illness since LMP  no 4. History of STI (GC, CT, HPV, syphilis, HIV)  no 5. History of recent travel :  no  Other pertinent information:  no     Review of Systems:10 point review of systems negative unless otherwise noted in HPI  Past Medical History:  Patient Active Problem List   Diagnosis Date Noted  . Delivery of pregnancy by cesarean section 11/12/2018  . Fetal intolerance to labor, delivered, current hospitalization 11/12/2018  . Postpartum care following cesarean delivery 11/12/2018  . Status post cesarean delivery 11/10/2018  . Indication for care in labor and delivery, antepartum 11/09/2018  . IUGR (intrauterine growth restriction) affecting care of mother 11/01/2018  . Anemia affecting pregnancy 09/07/2018  . Uterine fibroid during pregnancy, antepartum 07/28/2018    5.5 cm fibroid right posterior LUS   . Supervision of high-risk pregnancy 04/29/2018    Clinic [facility] Prenatal Labs  Dating  12 wk [place] Blood type: A/Positive/-- (02/28 1136)   Genetic Screen declined screening Antibody:Negative (02/28 1136)  Anatomic [place]  complete, normal XY Rubella: 4.90 (02/28 1136)  Varicella: Immune  GTT Third trimester:  RPR: Non Reactive (02/28 1136)   Rhogam Not needed HBsAg: Negative (02/28 1136)   TDaP vaccine  Flu Shot: declined HIV: Non Reactive (02/28 1136)   Baby Food   Breast                             GBS:   Contraception  Non-hormonal Pap: 04/29/18 NIL  CBB     CS/VBAC NA IUGR: iol at [place]  Hx SAB x2, failed IUI        Past Surgical History:  Past Surgical History:  Procedure Laterality Date  . CESAREAN SECTION N/A 11/10/2018   Procedure: PRIMARY CESAREAN SECTION;  Surgeon: Will Bonnet, MD;  Location: [facility] ORS;  Service: Obstetrics;  Laterality: N/A;  Baby Boy Born @ 1703 Apgars: 8/9 Weight: 8  lbs 14 ozs    Gynecologic History: No LMP recorded. Patient is pregnant.  Obstetric History: Y1O1751  Family History:  Family History  Problem Relation Age of Onset  . Lung cancer Maternal Grandfather 60  . Breast cancer Paternal Grandmother 72    Social History:  Social History   Socioeconomic History  . Marital status: Married    Spouse name: Not on file  . Number of children: Not on file  . Years of education: Not on file  . Highest education level: Not on file  Occupational History  . Not on file  Tobacco Use  . Smoking status: Former Smoker    Types: Cigarettes  . Smokeless tobacco: Never Used  Vaping Use  . Vaping Use: Never used  Substance and Sexual Activity  . Alcohol use: Yes  . Drug use: No  . Sexual activity: Yes    Birth control/protection: None  Other Topics Concern  . Not on file  Social History Narrative  . Not on file   Social Determinants of Health   Financial Resource Strain: Not on file  Food Insecurity: Not on file  Transportation Needs: Not on file  Physical Activity: Not on file  Stress: Not on file  Social Connections: Not on file  Intimate Partner Violence: Not on file    Allergies:  Allergies  Allergen Reactions  . Grape Flavor [Grape (Artificial) Flavor] Hives    Medications: Prior to Admission medications   Medication Sig Start Date End Date Taking? Authorizing Provider  ondansetron (ZOFRAN-ODT) 4 MG disintegrating tablet  04/25/20  Yes [provider]  Prenatal Vit-Fe Fumarate-FA (MULTIVITAMIN-PRENATAL)  27-0.8 MG TABS tablet Take 1 tablet by mouth daily at 12 noon.   Yes [provider]  ibuprofen (ADVIL) 600 MG tablet Take 1 tablet (600 mg total) by mouth every 6 (six) hours as needed for mild pain, moderate pain or cramping. Patient not taking: Reported on 01/19/2019 11/12/18   Dalia Heading, CNM  sertraline (ZOLOFT) 50 MG tablet Take 1 tablet (50 mg total) by mouth daily. 01/19/19   Will Bonnet, MD    Physical Exam Vitals: not currently breastfeeding.  General: NAD HEENT: normocephalic, anicteric Pulmonary: No increased work of breathing, CTAB Abdomen: soft, non-tender, non-distended.  Umbilicus without lesions.  No hepatomegaly, splenomegaly or masses palpable. No evidence of hernia +FHT Extremities: no edema, erythema, or tenderness Neurologic: Grossly intact Psychiatric: mood appropriate,  affect full   Assessment: 30 y.o. [id number] at Unknown presenting to initiate prenatal care  Plan: 1) Avoid alcoholic beverages. 2) Patient encouraged not to smoke.  3) Discontinue the use of all non-medicinal drugs and chemicals.  4) Take prenatal vitamins daily.  5) Nutrition, food safety (fish, cheese advisories, and high nitrite foods) and exercise discussed. 6) Hospital and practice style discussed with cross coverage system.  7) Genetic Screening, such as with 1st Trimester Screening, cell free fetal DNA, AFP testing, and Ultrasound, as well as with amniocentesis and CVS as appropriate, is discussed with patient. At the conclusion of today's visit patient declined genetic testing 8) Dating scan with MD next week, NOB labs today  Malachy Mood, MD, [place], [facility] 05/29/2020, 3:06 PM

## 2020-05-31 LAB — HGB FRACTIONATION CASCADE
Hgb A2: 2.6 % (ref 1.8–3.2)
Hgb A: 97.4 % (ref 96.4–98.8)
Hgb F: 0 % (ref 0.0–2.0)
Hgb S: 0 %

## 2020-05-31 LAB — RPR+RH+ABO+RUB AB+AB SCR+CB...
Antibody Screen: NEGATIVE
HIV Screen 4th Generation wRfx: NONREACTIVE
Hematocrit: 35.6 % (ref 34.0–46.6)
Hemoglobin: 11.5 g/dL (ref 11.1–15.9)
Hepatitis B Surface Ag: NEGATIVE
MCH: 26.1 pg — ABNORMAL LOW (ref 26.6–33.0)
MCHC: 32.3 g/dL (ref 31.5–35.7)
MCV: 81 fL (ref 79–97)
Platelets: 325 10*3/uL (ref 150–450)
RBC: 4.4 x10E6/uL (ref 3.77–5.28)
RDW: 13.4 % (ref 11.7–15.4)
RPR Ser Ql: NONREACTIVE
Rh Factor: POSITIVE
Rubella Antibodies, IGG: 6.76 index (ref >0.99)
Varicella zoster IgG: 3191 index (ref >165)
WBC: 10.7 10*3/uL (ref 3.4–10.8)

## 2020-05-31 LAB — URINE CULTURE

## 2020-06-07 ENCOUNTER — Other Ambulatory Visit: Payer: Self-pay

## 2020-06-07 ENCOUNTER — Ambulatory Visit (INDEPENDENT_AMBULATORY_CARE_PROVIDER_SITE_OTHER): Payer: Medicaid Other | Admitting: Obstetrics and Gynecology

## 2020-06-07 ENCOUNTER — Encounter: Payer: Self-pay | Admitting: Obstetrics and Gynecology

## 2020-06-07 VITALS — BP 118/68 | HR 86 | Wt 130.0 lb

## 2020-06-07 DIAGNOSIS — Z3A14 14 weeks gestation of pregnancy: Secondary | ICD-10-CM

## 2020-06-07 DIAGNOSIS — Z363 Encounter for antenatal screening for malformations: Secondary | ICD-10-CM

## 2020-06-07 DIAGNOSIS — O3410 Maternal care for benign tumor of corpus uteri, unspecified trimester: Secondary | ICD-10-CM

## 2020-06-07 DIAGNOSIS — Z3480 Encounter for supervision of other normal pregnancy, unspecified trimester: Secondary | ICD-10-CM

## 2020-06-07 DIAGNOSIS — Z8759 Personal history of other complications of pregnancy, childbirth and the puerperium: Secondary | ICD-10-CM

## 2020-06-07 DIAGNOSIS — O34219 Maternal care for unspecified type scar from previous cesarean delivery: Secondary | ICD-10-CM

## 2020-06-07 DIAGNOSIS — D259 Leiomyoma of uterus, unspecified: Secondary | ICD-10-CM

## 2020-06-07 NOTE — Progress Notes (Signed)
Routine Prenatal Care Visit  Subjective  Diana Stuart is a 30 y.o. [id number] at Unknown being seen today for ongoing prenatal care.  She is currently monitored for the following issues for this low-risk pregnancy and has Uterine fibroid during pregnancy, antepartum; Pregnancy with history of cesarean section, antepartum; Supervision of other normal pregnancy, antepartum; and History of prior pregnancy with IUGR newborn on their problem list.  ----------------------------------------------------------------------------------- Patient reports no complaints.   Contractions: Not present. Vag. Bleeding: None.  Movement: Absent. Denies leaking of fluid.  ----------------------------------------------------------------------------------- The following portions of the patient's history were reviewed and updated as appropriate: allergies, current medications, past family history, past medical history, past social history, past surgical history and problem list. Problem list updated.   Objective  unknown if currently breastfeeding. Pregravid weight 120 lb (54.4 kg) Total Weight Gain 10 lb (4.536 kg) Urinalysis:      Fetal Status: Fetal Heart Rate (bpm): 152   Movement: Absent     General:  Alert, oriented and cooperative. Patient is in no acute distress.  Skin: Skin is warm and dry. No rash noted.   Cardiovascular: Normal heart rate noted  Respiratory: Normal respiratory effort, no problems with respiration noted  Abdomen: Soft, gravid, appropriate for gestational age. Pain/Pressure: Absent     Pelvic:  Cervical exam deferred        Extremities: Normal range of motion.     ental Status: Normal mood and affect. Normal behavior. Normal judgment and thought content.   TVUS/Abdominal US  Singleton viable IUP ultrasound gestational age [redacted]w[redacted]d EDD 12/06/2020 FHT 154 BPM CRL Avg [redacted]w[redacted]d or 6.71cm (7.07cm, 6.31cm, 6.76cm) BPD [redacted]w[redacted]d or 2.61cm (2.68cm, 2.53cm) FL [redacted]w[redacted]d or 1.46cm (1.45cm,  1.47cm) ROV images normal LOV images normal No free fluid Uterus: posterior lower uterine segment fibroid visualized      Assessment   30 y.o. [id number] at Unknown by  Not found. presenting for routine prenatal visit  Plan   Pregnancy 4 Problems (from 06/07/20 to present)    Problem Noted Resolved   Supervision of other normal pregnancy, antepartum 05/29/2020 by Malachy Mood, MD No   Overview Signed 06/07/2020  4:45 PM by Malachy Mood, MD    Clinic [facility] Prenatal Labs  Dating 14 week [place] Blood type: A/Positive/-- (03/30 1549)   Genetic Screen Declines Antibody:Negative (03/30 1549)  Anatomic [place]  Rubella: 6.76 (03/30 1549) Varicella: Immune  GTT Early:               Third trimester:  RPR: Non Reactive (03/30 1549)   Rhogam  HBsAg: Negative (03/30 1549)   TDaP vaccine                       Flu Shot: HIV: Non Reactive (03/30 1549)   Baby Food                                GBS:   Contraception  Pap:04/29/2020 NILM  CBB     CS/VBAC    Support Person                Gestational age appropriate obstetric precautions including but not limited to vaginal bleeding, contractions, leaking of fluid and fetal movement were reviewed in detail with the patient.    Return in about 4 weeks (around 07/05/2020) for 4 week ROB, and 6-8 week ROB anatomy scan.  Malachy Mood, MD, [facility], [facility] 06/07/2020, 4:47 PM

## 2020-06-10 ENCOUNTER — Other Ambulatory Visit: Payer: Self-pay | Admitting: Obstetrics and Gynecology

## 2020-06-10 MED ORDER — ONDANSETRON 4 MG PO TBDP: 4.0000 mg | ORAL_TABLET | Freq: Three times a day (TID) | ORAL | 2 refills | Status: DC | PRN

## 2020-07-05 ENCOUNTER — Encounter: Payer: Medicaid Other | Admitting: Obstetrics and Gynecology

## 2020-07-08 ENCOUNTER — Other Ambulatory Visit: Payer: Self-pay

## 2020-07-08 ENCOUNTER — Ambulatory Visit (INDEPENDENT_AMBULATORY_CARE_PROVIDER_SITE_OTHER): Payer: Medicaid Other | Admitting: Obstetrics and Gynecology

## 2020-07-08 VITALS — BP 116/70 | Wt 134.0 lb

## 2020-07-08 DIAGNOSIS — Z3480 Encounter for supervision of other normal pregnancy, unspecified trimester: Secondary | ICD-10-CM

## 2020-07-08 DIAGNOSIS — Z3A18 18 weeks gestation of pregnancy: Secondary | ICD-10-CM

## 2020-07-08 NOTE — Progress Notes (Signed)
    Routine Prenatal Care Visit  Subjective  Diana Stuart is a 30 y.o. G5P1021 at [redacted]w[redacted]d being seen today for ongoing prenatal care.  She is currently monitored for the following issues for this high-risk pregnancy and has Leiomyoma of uterus, unspecified; Pregnancy with history of cesarean section, antepartum; Supervision of other normal pregnancy, antepartum; and History of prior pregnancy with IUGR newborn on their problem list.  ----------------------------------------------------------------------------------- Patient reports no complaints.   Contractions: Not present. Vag. Bleeding: None.  Movement: Present. Denies leaking of fluid.  ----------------------------------------------------------------------------------- The following portions of the patient's history were reviewed and updated as appropriate: allergies, current medications, past family history, past medical history, past social history, past surgical history and problem list. Problem list updated.   Objective  Blood pressure 116/70, weight 134 lb (60.8 kg), last menstrual period 03/05/2020, unknown if currently breastfeeding. Pregravid weight 120 lb (54.4 kg) Total Weight Gain 14 lb (6.35 kg) Urinalysis:      Fetal Status: Fetal Heart Rate (bpm): 150   Movement: Present     General:  Alert, oriented and cooperative. Patient is in no acute distress.  Skin: Skin is warm and dry. No rash noted.   Cardiovascular: Normal heart rate noted  Respiratory: Normal respiratory effort, no problems with respiration noted  Abdomen: Soft, gravid, appropriate for gestational age. Pain/Pressure: Absent     Pelvic:  Cervical exam deferred        Extremities: Normal range of motion.     ental Status: Normal mood and affect. Normal behavior. Normal judgment and thought content.     Assessment   30 y.o. [id number] at [redacted]w[redacted]d by  12/06/2020, by Ultrasound presenting for routine prenatal visit  Plan   Pregnancy 4 Problems (from 06/07/20  to present)    Problem Noted Resolved   Supervision of other normal pregnancy, antepartum 05/29/2020 by Malachy Mood, MD No   Overview Addendum 07/08/2020  2:41 PM by Orlie Pollen, CNM     Nursing Staff Provider  Office Location  [facility] Dating   14 week [place]  Language  English Anatomy US   ordered  Flu Vaccine   Received Fall 2021 Genetic Screen  declines  TDaP vaccine    Hgb A1C or  GTT Early : n/a Third trimester :   Rhogam   n/a   LAB RESULTS   Feeding Plan  breast and formula Blood Type A/Positive/-- (03/30 1549)   Contraception  Considering nexplanon Antibody Negative (03/30 1549)  Circumcision  Rubella 6.76 (03/30 1549)  Pediatrician   RPR Non Reactive (03/30 1549)   Support Person  Husband "Joey" HBsAg Negative (03/30 1549)   Prenatal Classes  HIV Non Reactive (03/30 1549)    Varicella  immune  BTL Consent  n/a GBS  (For PCN allergy, check sensitivities)        VBAC Consent  desires R CS Pap  04/29/20 NILM    Hgb Electro      CF      SMA               Previous Version      Second trimester precautions including but not limited to vaginal bleeding, contractions, leaking of fluid and fetal movement were reviewed in detail with the patient.    Keep previously scheduled f/u - Anatomy scan on 5/31 with f/u on 6/1 at [facility], [place], [place], [place] 07/08/2020, 2:41 PM

## 2020-07-30 ENCOUNTER — Other Ambulatory Visit: Payer: Self-pay

## 2020-07-30 ENCOUNTER — Ambulatory Visit
Admission: RE | Admit: 2020-07-30 | Discharge: 2020-07-30 | Disposition: A | Discharge status: Home / Self Care | Payer: Medicaid Other | Source: Ambulatory Visit | Attending: Obstetrics and Gynecology | Admitting: Obstetrics and Gynecology

## 2020-07-30 DIAGNOSIS — Z3480 Encounter for supervision of other normal pregnancy, unspecified trimester: Secondary | ICD-10-CM | POA: Diagnosis present

## 2020-07-30 DIAGNOSIS — O34219 Maternal care for unspecified type scar from previous cesarean delivery: Secondary | ICD-10-CM | POA: Insufficient documentation

## 2020-07-30 DIAGNOSIS — Z363 Encounter for antenatal screening for malformations: Secondary | ICD-10-CM | POA: Diagnosis present

## 2020-07-31 ENCOUNTER — Encounter: Payer: Self-pay | Admitting: Obstetrics and Gynecology

## 2020-07-31 ENCOUNTER — Encounter: Payer: Medicaid Other | Admitting: Obstetrics and Gynecology

## 2020-07-31 ENCOUNTER — Ambulatory Visit (INDEPENDENT_AMBULATORY_CARE_PROVIDER_SITE_OTHER): Payer: Medicaid Other | Admitting: Obstetrics and Gynecology

## 2020-07-31 VITALS — BP 100/60 | Wt 138.0 lb

## 2020-07-31 DIAGNOSIS — Z3480 Encounter for supervision of other normal pregnancy, unspecified trimester: Secondary | ICD-10-CM

## 2020-07-31 DIAGNOSIS — Z3A21 21 weeks gestation of pregnancy: Secondary | ICD-10-CM

## 2020-07-31 LAB — POCT URINALYSIS DIPSTICK OB
Glucose, UA: NEGATIVE
POC,PROTEIN,UA: NEGATIVE

## 2020-07-31 NOTE — Addendum Note (Signed)
Addended by: Drenda Freeze on: 07/31/2020 04:03 PM   Modules accepted: Orders

## 2020-07-31 NOTE — Progress Notes (Signed)
    Routine Prenatal Care Visit  Subjective  Diana Stuart is a 30 y.o. G5P1021 at [redacted]w[redacted]d being seen today for ongoing prenatal care.  She is currently monitored for the following issues for this high-risk pregnancy and has Leiomyoma of uterus, unspecified; Pregnancy with history of cesarean section, antepartum; Supervision of other normal pregnancy, antepartum; and History of prior pregnancy with IUGR newborn on their problem list.  ----------------------------------------------------------------------------------- Patient reports no complaints.   Contractions: Not present. Vag. Bleeding: None.  Movement: Present. Denies leaking of fluid.  ----------------------------------------------------------------------------------- The following portions of the patient's history were reviewed and updated as appropriate: allergies, current medications, past family history, past medical history, past social history, past surgical history and problem list. Problem list updated.   Objective  Blood pressure 100/60, weight 138 lb (62.6 kg), last menstrual period 03/05/2020, unknown if currently breastfeeding. Pregravid weight 120 lb (54.4 kg) Total Weight Gain 18 lb (8.165 kg) Urinalysis:      Fetal Status: Fetal Heart Rate (bpm): 150 Fundal Height: 21 cm Movement: Present     General:  Alert, oriented and cooperative. Patient is in no acute distress.  Skin: Skin is warm and dry. No rash noted.   Cardiovascular: Normal heart rate noted  Respiratory: Normal respiratory effort, no problems with respiration noted  Abdomen: Soft, gravid, appropriate for gestational age. Pain/Pressure: Absent     Pelvic:  Cervical exam deferred        Extremities: Normal range of motion.     ental Status: Normal mood and affect. Normal behavior. Normal judgment and thought content.   Assessment   30 y.o. [id number] at [redacted]w[redacted]d by  12/06/2020, by Ultrasound presenting for routine prenatal visit  Plan   Pregnancy 4  Problems (from 06/07/20 to present)    Problem Noted Resolved   Supervision of other normal pregnancy, antepartum 05/29/2020 by Malachy Mood, MD No   Overview Addendum 07/31/2020  1:09 PM by Malachy Mood, MD     Nursing Staff Provider  Office Location  [facility] Dating   14 week [place]  Language  English Anatomy US  Normal female 07/30/2020 [facility]  Flu Vaccine   Received Fall 2021 Genetic Screen  declines  TDaP vaccine    Hgb A1C or  GTT Early : n/a Third trimester :   Rhogam   n/a   LAB RESULTS   Feeding Plan  breast and formula Blood Type A/Positive/-- (03/30 1549)   Contraception  Considering nexplanon Antibody Negative (03/30 1549)  Circumcision  Rubella 6.76 (03/30 1549)  Pediatrician   RPR Non Reactive (03/30 1549)   Support Person  Husband "Joey" HBsAg Negative (03/30 1549)   Prenatal Classes  HIV Non Reactive (03/30 1549)    Varicella  immune  BTL Consent  n/a GBS  (For PCN allergy, check sensitivities)        VBAC Consent  desires R CS Pap  04/29/20 NILM    Hgb Electro      CF      SMA               Previous Version      -Reviewed US findings with patient - normal, complete, female  Second trimester precautions including but not limited to vaginal bleeding, contractions, leaking of fluid and fetal movement were reviewed in detail with the patient.    Return in about 4 weeks (around 08/28/2020) for ROB.  Orlie Pollen, CNM, [facility], [facility] 07/31/2020, 3:54 PM

## 2020-08-28 ENCOUNTER — Other Ambulatory Visit: Payer: Self-pay

## 2020-08-28 ENCOUNTER — Encounter: Payer: Self-pay | Admitting: Obstetrics & Gynecology

## 2020-08-28 ENCOUNTER — Ambulatory Visit (INDEPENDENT_AMBULATORY_CARE_PROVIDER_SITE_OTHER): Payer: Medicaid Other | Admitting: Obstetrics & Gynecology

## 2020-08-28 VITALS — BP 120/80 | Wt 145.0 lb

## 2020-08-28 DIAGNOSIS — Z8759 Personal history of other complications of pregnancy, childbirth and the puerperium: Secondary | ICD-10-CM

## 2020-08-28 DIAGNOSIS — Z3482 Encounter for supervision of other normal pregnancy, second trimester: Secondary | ICD-10-CM

## 2020-08-28 DIAGNOSIS — Z131 Encounter for screening for diabetes mellitus: Secondary | ICD-10-CM

## 2020-08-28 DIAGNOSIS — Z3A25 25 weeks gestation of pregnancy: Secondary | ICD-10-CM

## 2020-08-28 DIAGNOSIS — Z98891 History of uterine scar from previous surgery: Secondary | ICD-10-CM

## 2020-08-28 NOTE — Progress Notes (Signed)
Subjective  Fetal Movement? yes Contractions? no Leaking Fluid? no Vaginal Bleeding? no  Objective  BP 120/80   Wt 145 lb (65.8 kg)   LMP 03/05/2020 (Exact Date)   BMI 28.32 kg/m  General: NAD Pumonary: no increased work of breathing Abdomen: gravid, non-tender Extremities: no edema Psychiatric: mood appropriate, affect full  Assessment  30 y.o. [id number] at [redacted]w[redacted]d by  12/06/2020, by Ultrasound presenting for routine prenatal visit  Plan   Problem List Items Addressed This Visit      Other   Supervision of other normal pregnancy, antepartum - Primary   History of prior pregnancy with IUGR newborn   Relevant Orders   US OB Follow Up   History of cesarean delivery  Other Visit Diagnoses    [redacted] weeks gestation of pregnancy       Screening for diabetes mellitus       Relevant Orders   28 Week RH+Panel    PNV [place] for growth due to prior h/o IUGR Glucola nv Plans CS at 50 weeks, w Dr Glennon Mac Plans Nexplanon, desires to be placed soon after delivery  Pregnancy 4 Problems (from 06/07/20 to present)    Problem Noted Resolved   History of cesarean delivery 08/28/2020 by Gae Dry, MD No   Overview Signed 08/28/2020  3:48 PM by Gae Dry, MD    OB/GYN  Counseling Note  30 y.o. [phone] at [redacted]w[redacted]d with Estimated Date of Delivery: 12/06/20 was seen today in office to discuss trial of labor after cesarean section (TOLAC) versus elective repeat cesarean delivery (ERCD). The following risks were discussed with the patient.  Risk of uterine rupture at term is 0.78 percent with TOLAC and 0.22 percent with ERCD. 1 in 10 uterine ruptures will result in neonatal death or neurological injury. The benefits of a trial of labor after cesarean (TOLAC) resulting in a vaginal birth after cesarean (VBAC) include the following: shorter length of hospital stay and postpartum recovery (in most cases); fewer complications, such as postpartum fever, wound or uterine infection, thromboembolism  (blood clots in the leg or lung), need for blood transfusion and fewer neonatal breathing problems.  The risks of an attempted VBAC or TOLAC include the following: Risk of failed trial of labor after cesarean (TOLAC) without a vaginal birth after cesarean (VBAC) resulting in repeat cesarean delivery (RCD) in about 20 to 64 percent of women who attempt VBAC.  Risk of rupture of uterus resulting in an emergency cesarean delivery. The risk of uterine rupture may be related in part to the type of uterine incision made during the first cesarean delivery. A previous transverse uterine incision has the lowest risk of rupture (0.2 to 1.5 percent risk). Vertical or T-shaped uterine incisions have a higher risk of uterine rupture (4 to 9 percent risk)The risk of fetal death is very low with both VBAC and elective repeat cesarean delivery (ERCD), but the likelihood of fetal death is higher with VBAC than with ERCD. Maternal death is very rare with either type of delivery.  The risks of an elective repeat cesarean delivery (ERCD) were reviewed with the patient including but not limited to: 04/998 risk of uterine rupture which could have serious consequences, bleeding which may require transfusion; infection which may require antibiotics; injury to bowel, bladder or other surrounding organs (bowel, bladder, ureters); injury to the fetus; need for additional procedures including hysterectomy in the event of a life-threatening hemorrhage; thromboembolic phenomenon; abnormal placentation; incisional problems; death and other postoperative or anesthesia complications.  Desires R CS       Supervision of other normal pregnancy, antepartum 05/29/2020 by Malachy Mood, MD No   Overview Addendum 08/28/2020  3:49 PM by Gae Dry, MD     Nursing Staff Provider  Office Location  [facility] Dating   14 week [place]  Language  English Anatomy US  Normal female 07/30/2020 [facility]  Flu Vaccine   Received Fall 2021 Genetic  Screen  declines  TDaP vaccine    Hgb A1C or  GTT Early : n/a Third trimester :   Rhogam   n/a   LAB RESULTS   Feeding Plan  breast and formula Blood Type A/Positive/-- (03/30 1549)   Contraception  Considering nexplanon at first post op appt Antibody Negative (03/30 1549)  Circumcision  Rubella 6.76 (03/30 1549)  Pediatrician   RPR Non Reactive (03/30 1549)   Support Person  Husband "Joey" HBsAg Negative (03/30 1549)   Prenatal Classes  HIV Non Reactive (03/30 1549)    Varicella  immune  BTL Consent  n/a GBS  (For PCN allergy, check sensitivities)        VBAC Consent  desires R CS Pap  04/29/20 NILM    Hgb Electro      CF      SMA                    Barnett Applebaum, MD, [place], [facility] 08/28/2020  3:52 PM

## 2020-08-28 NOTE — Patient Instructions (Signed)
Thank you for choosing [facility]. As part of our ongoing efforts to improve patient experience, we would appreciate your feedback. Please fill out the short survey that you will receive by mail or MyChart. Your opinion is important to us! -Dr Briona Korpela  Third Trimester of Pregnancy  The third trimester of pregnancy is from week 28 through week 40. This is months 7 through 9. The third trimester is a time when the unborn baby (fetus) is growing rapidly. At the end of the ninth month, the fetus is about 20inches long and weighs 6-10 pounds. Body changes during your third trimester During the third trimester, your body will continue to go through many changes.The changes vary and generally return to normal after your baby is born. Physical changes Your weight will continue to increase. You can expect to gain 25-35 pounds (11-16 kg) by the end of the pregnancy if you begin pregnancy at a normal weight. If you are underweight, you can expect to gain 28-40 lb (about 13-18 kg), and if you are overweight, you can expect to gain 15-25 lb (about 7-11 kg). You may begin to get stretch marks on your hips, abdomen, and breasts. Your breasts will continue to grow and may hurt. A yellow fluid (colostrum) may leak from your breasts. This is the first milk you are producing for your baby. You may have changes in your hair. These can include thickening of your hair, rapid growth, and changes in texture. Some people also have hair loss during or after pregnancy, or hair that feels dry or thin. Your belly button may stick out. You may notice more swelling in your hands, face, or ankles. Health changes You may have heartburn. You may have constipation. You may develop hemorrhoids. You may develop swollen, bulging veins (varicose veins) in your legs. You may have increased body aches in the pelvis, back, or thighs. This is due to weight gain and increased hormones that are relaxing your joints. You may have  increased tingling or numbness in your hands, arms, and legs. The skin on your abdomen may also feel numb. You may feel short of breath because of your expanding uterus. Other changes You may urinate more often because the fetus is moving lower into your pelvis and pressing on your bladder. You may have more problems sleeping. This may be caused by the size of your abdomen, an increased need to urinate, and an increase in your body's metabolism. You may notice the fetus "dropping," or moving lower in your abdomen (lightening). You may have increased vaginal discharge. You may notice that you have pain around your pelvic bone as your uterus distends. Follow these instructions at home: Medicines Follow your health care provider's instructions regarding medicine use. Specific medicines may be either safe or unsafe to take during pregnancy. Do not take any medicines unless approved by your health care provider. Take a prenatal vitamin that contains at least 600 micrograms (mcg) of folic acid. Eating and drinking Eat a healthy diet that includes fresh fruits and vegetables, whole grains, good sources of protein such as meat, eggs, or tofu, and low-fat dairy products. Avoid raw meat and unpasteurized juice, milk, and cheese. These carry germs that can harm you and your baby. Eat 4 or 5 small meals rather than 3 large meals a day. You may need to take these actions to prevent or treat constipation: Drink enough fluid to keep your urine pale yellow. Eat foods that are high in fiber, such as beans, whole grains,   and fresh fruits and vegetables. Limit foods that are high in fat and processed sugars, such as fried or sweet foods. Activity Exercise only as directed by your health care provider. Most people can continue their usual exercise routine during pregnancy. Try to exercise for 30 minutes at least 5 days a week. Stop exercising if you experience contractions in the uterus. Stop exercising if you  develop pain or cramping in the lower abdomen or lower back. Avoid heavy lifting. Do not exercise if it is very hot or humid or if you are at a high altitude. If you choose to, you may continue to have sex unless your health care provider tells you not to. Relieving pain and discomfort Take frequent breaks and rest with your legs raised (elevated) if you have leg cramps or low back pain. Take warm sitz baths to soothe any pain or discomfort caused by hemorrhoids. Use hemorrhoid cream if your health care provider approves. Wear a supportive bra to prevent discomfort from breast tenderness. If you develop varicose veins: Wear support hose as told by your health care provider. Elevate your feet for 15 minutes, 3-4 times a day. Limit salt in your diet. Safety Talk to your health care provider before traveling far distances. Do not use hot tubs, steam rooms, or saunas. Wear your seat belt at all times when driving or riding in a car. Talk with your health care provider if someone is verbally or physically abusive to you. Preparing for birth To prepare for the arrival of your baby: Take prenatal classes to understand, practice, and ask questions about labor and delivery. Visit the hospital and tour the maternity area. Purchase a rear-facing car seat and make sure you know how to install it in your car. Prepare the baby's room or sleeping area. Make sure to remove all pillows and stuffed animals from the baby's crib to prevent suffocation. General instructions Avoid cat litter boxes and soil used by cats. These carry germs that can cause birth defects in the baby. If you have a cat, ask someone to clean the litter box for you. Do not douche or use tampons. Do not use scented sanitary pads. Do not use any products that contain nicotine or tobacco, such as cigarettes, e-cigarettes, and chewing tobacco. If you need help quitting, ask your health care provider. Do not use any herbal remedies, illegal  drugs, or medicines that were not prescribed to you. Chemicals in these products can harm your baby. Do not drink alcohol. You will have more frequent prenatal exams during the third trimester. During a routine prenatal visit, your health care provider will do a physical exam, perform tests, and discuss your overall health. Keep all follow-up visits. This is important. Where to find more information American Pregnancy Association: americanpregnancy.org American College of Obstetricians and Gynecologists: acog.org/en/Womens%20Health/Pregnancy Office on Women's Health: womenshealth.gov/pregnancy Contact a health care provider if you have: A fever. Mild pelvic cramps, pelvic pressure, or nagging pain in your abdominal area or lower back. Vomiting or diarrhea. Bad-smelling vaginal discharge or foul-smelling urine. Pain when you urinate. A headache that does not go away when you take medicine. Visual changes or see spots in front of your eyes. Get help right away if: Your water breaks. You have regular contractions less than 5 minutes apart. You have spotting or bleeding from your vagina. You have severe abdominal pain. You have difficulty breathing. You have chest pain. You have fainting spells. You have not felt your baby move for the time period   told by your health care provider. You have new or increased pain, swelling, or redness in an arm or leg. Summary The third trimester of pregnancy is from week 28 through week 40 (months 7 through 9). You may have more problems sleeping. This can be caused by the size of your abdomen, an increased need to urinate, and an increase in your body's metabolism. You will have more frequent prenatal exams during the third trimester. Keep all follow-up visits. This is important. This information is not intended to replace advice given to you by your health care provider. Make sure you discuss any questions you have with your healthcare provider. Document  Revised: 07/26/2019 Document Reviewed: 06/01/2019 Elsevier Patient Education  2022 [employer]  

## 2020-08-29 ENCOUNTER — Telehealth: Payer: Self-pay | Admitting: Obstetrics & Gynecology

## 2020-08-29 NOTE — Telephone Encounter (Signed)
Left message for pt concerning an ultrasound that has been scheduled.  Asked her to call the office.

## 2020-09-19 ENCOUNTER — Other Ambulatory Visit: Payer: Medicaid Other

## 2020-09-19 ENCOUNTER — Encounter: Payer: Medicaid Other | Admitting: Obstetrics and Gynecology

## 2020-09-27 ENCOUNTER — Ambulatory Visit: Payer: Medicaid Other

## 2020-09-30 ENCOUNTER — Other Ambulatory Visit: Payer: Medicaid Other

## 2020-09-30 ENCOUNTER — Ambulatory Visit
Admission: RE | Admit: 2020-09-30 | Discharge: 2020-09-30 | Disposition: A | Discharge status: Home / Self Care | Payer: Medicaid Other | Source: Ambulatory Visit | Attending: Obstetrics & Gynecology | Admitting: Obstetrics & Gynecology

## 2020-09-30 ENCOUNTER — Encounter: Payer: Medicaid Other | Admitting: Obstetrics & Gynecology

## 2020-09-30 ENCOUNTER — Other Ambulatory Visit: Payer: Self-pay

## 2020-09-30 DIAGNOSIS — Z8759 Personal history of other complications of pregnancy, childbirth and the puerperium: Secondary | ICD-10-CM | POA: Diagnosis present

## 2020-09-30 DIAGNOSIS — Z131 Encounter for screening for diabetes mellitus: Secondary | ICD-10-CM

## 2020-10-01 ENCOUNTER — Ambulatory Visit (INDEPENDENT_AMBULATORY_CARE_PROVIDER_SITE_OTHER): Payer: Medicaid Other | Admitting: Obstetrics

## 2020-10-01 VITALS — BP 100/60 | Wt 151.0 lb

## 2020-10-01 DIAGNOSIS — Z3A30 30 weeks gestation of pregnancy: Secondary | ICD-10-CM

## 2020-10-01 DIAGNOSIS — Z3480 Encounter for supervision of other normal pregnancy, unspecified trimester: Secondary | ICD-10-CM

## 2020-10-01 LAB — 28 WEEK RH+PANEL
Basophils Absolute: 0 10*3/uL (ref 0.0–0.2)
Basos: 0 %
EOS (ABSOLUTE): 0.2 10*3/uL (ref 0.0–0.4)
Eos: 2 %
Gestational Diabetes Screen: 116 mg/dL (ref 65–139)
HIV Screen 4th Generation wRfx: NONREACTIVE
Hematocrit: 30 % — ABNORMAL LOW (ref 34.0–46.6)
Hemoglobin: 9.7 g/dL — ABNORMAL LOW (ref 11.1–15.9)
Immature Grans (Abs): 0.1 10*3/uL (ref 0.0–0.1)
Immature Granulocytes: 1 %
Lymphocytes Absolute: 2 10*3/uL (ref 0.7–3.1)
Lymphs: 21 %
MCH: 26.3 pg — ABNORMAL LOW (ref 26.6–33.0)
MCHC: 32.3 g/dL (ref 31.5–35.7)
MCV: 81 fL (ref 79–97)
Monocytes Absolute: 0.8 10*3/uL (ref 0.1–0.9)
Monocytes: 8 %
Neutrophils Absolute: 6.7 10*3/uL (ref 1.4–7.0)
Neutrophils: 68 %
Platelets: 341 10*3/uL (ref 150–450)
RBC: 3.69 x10E6/uL — ABNORMAL LOW (ref 3.77–5.28)
RDW: 13 % (ref 11.7–15.4)
RPR Ser Ql: NONREACTIVE
WBC: 9.8 10*3/uL (ref 3.4–10.8)

## 2020-10-01 NOTE — Progress Notes (Signed)
ROB - no concerns. RM 4

## 2020-10-01 NOTE — Progress Notes (Signed)
Routine Prenatal Care Visit  Subjective  Diana Stuart is a 30 y.o. G4P1021 at 75w4dbeing seen today for ongoing prenatal care.  She is currently monitored for the following issues for this low-risk pregnancy and has Leiomyoma of uterus, unspecified; Pregnancy with history of cesarean section, antepartum; Supervision of other normal pregnancy, antepartum; History of prior pregnancy with IUGR newborn; and History of cesarean delivery on their problem list.  ----------------------------------------------------------------------------------- Patient reports no complaints.    .  .   .Jacklyn ShellFluid denies.  ----------------------------------------------------------------------------------- The following portions of the patient's history were reviewed and updated as appropriate: allergies, current medications, past family history, past medical history, past social history, past surgical history and problem list. Problem list updated.  Objective  Blood pressure 100/60, weight 151 lb (68.5 kg), last menstrual period 03/05/2020, unknown if currently breastfeeding. Pregravid weight 120 lb (54.4 kg) Total Weight Gain 31 lb (14.1 kg) Urinalysis: Urine Protein    Urine Glucose    Fetal Status:           General:  Alert, oriented and cooperative. Patient is in no acute distress.  Skin: Skin is warm and dry. No rash noted.   Cardiovascular: Normal heart rate noted  Respiratory: Normal respiratory effort, no problems with respiration noted  Abdomen: Soft, gravid, appropriate for gestational age.       Pelvic:  Cervical exam deferred        Extremities: Normal range of motion.     Mental Status: Normal mood and affect. Normal behavior. Normal judgment and thought content.   Assessment   30y.o. [id number] 370w4dy  12/06/2020, by Ultrasound presenting for routine prenatal visit  Plan   Pregnancy 4 Problems (from 06/07/20 to present)    Problem Noted Resolved   History of cesarean delivery  08/28/2020 by HaGae DryMD No   Overview Signed 08/28/2020  3:48 PM by HaGae DryMD    OB/GYN  Counseling Note  [age].o. [id number] 2559w5dth Estimated Date of Delivery: 12/06/20 was seen today in office to discuss trial of labor after cesarean section (TOLAC) versus elective repeat cesarean delivery (ERCD). The following risks were discussed with the patient.  Risk of uterine rupture at term is 0.78 percent with TOLAC and 0.22 percent with ERCD. 1 in 10 uterine ruptures will result in neonatal death or neurological injury. The benefits of a trial of labor after cesarean (TOLAC) resulting in a vaginal birth after cesarean (VBAC) include the following: shorter length of hospital stay and postpartum recovery (in most cases); fewer complications, such as postpartum fever, wound or uterine infection, thromboembolism (blood clots in the leg or lung), need for blood transfusion and fewer neonatal breathing problems.  The risks of an attempted VBAC or TOLAC include the following: Risk of failed trial of labor after cesarean (TOLAC) without a vaginal birth after cesarean (VBAC) resulting in repeat cesarean delivery (RCD) in about 20 to 40 40rcent of women who attempt VBAC.  Risk of rupture of uterus resulting in an emergency cesarean delivery. The risk of uterine rupture may be related in part to the type of uterine incision made during the first cesarean delivery. A previous transverse uterine incision has the lowest risk of rupture (0.2 to 1.5 percent risk). Vertical or T-shaped uterine incisions have a higher risk of uterine rupture (4 to 9 percent risk)The risk of fetal death is very low with both VBAC and elective repeat cesarean delivery (ERCD), but the likelihood of  fetal death is higher with VBAC than with ERCD. Maternal death is very rare with either type of delivery.  The risks of an elective repeat cesarean delivery (ERCD) were reviewed with the patient including but not limited to:  04/998 risk of uterine rupture which could have serious consequences, bleeding which may require transfusion; infection which may require antibiotics; injury to bowel, bladder or other surrounding organs (bowel, bladder, ureters); injury to the fetus; need for additional procedures including hysterectomy in the event of a life-threatening hemorrhage; thromboembolic phenomenon; abnormal placentation; incisional problems; death and other postoperative or anesthesia complications.    Desires R CS      Supervision of other normal pregnancy, antepartum 05/29/2020 by Malachy Mood, MD No   Overview Addendum 08/28/2020  3:49 PM by Gae Dry, MD     Nursing Staff Provider  Office Location  [facility] Dating   14 week [place]  Language  English Anatomy US  Normal female 07/30/2020 [facility]  Flu Vaccine   Received Fall 2021 Genetic Screen  declines  TDaP vaccine    Hgb A1C or  GTT Early : n/a Third trimester :   Rhogam   n/a   LAB RESULTS   Feeding Plan  breast and formula Blood Type A/Positive/-- (03/30 1549)   Contraception  Considering nexplanon at first post op appt Antibody Negative (03/30 1549)  Circumcision  Rubella 6.76 (03/30 1549)  Pediatrician   RPR Non Reactive (03/30 1549)   Support Person  Husband "Joey" HBsAg Negative (03/30 1549)   Prenatal Classes  HIV Non Reactive (03/30 1549)    Varicella  immune  BTL Consent  n/a GBS  (For PCN allergy, check sensitivities)        VBAC Consent  desires R CS Pap  04/29/20 NILM    Hgb Electro      CF      SMA                   Preterm labor symptoms and general obstetric precautions including but not limited to vaginal bleeding, contractions, leaking of fluid and fetal movement were reviewed in detail with the patient. Please refer to After Visit Summary for other counseling recommendations.   Return in about 2 weeks (around 10/15/2020) for return OB.  [name], CNM  10/01/2020 3:24 PM

## 2020-10-17 ENCOUNTER — Ambulatory Visit (INDEPENDENT_AMBULATORY_CARE_PROVIDER_SITE_OTHER): Payer: Medicaid Other | Admitting: Obstetrics

## 2020-10-17 ENCOUNTER — Other Ambulatory Visit: Payer: Self-pay

## 2020-10-17 VITALS — BP 100/60 | Wt 153.0 lb

## 2020-10-17 DIAGNOSIS — Z3A32 32 weeks gestation of pregnancy: Secondary | ICD-10-CM

## 2020-10-17 DIAGNOSIS — Z3480 Encounter for supervision of other normal pregnancy, unspecified trimester: Secondary | ICD-10-CM

## 2020-10-17 LAB — POCT URINALYSIS DIPSTICK OB
Glucose, UA: NEGATIVE
POC,PROTEIN,UA: NEGATIVE

## 2020-10-17 NOTE — Progress Notes (Signed)
Routine Prenatal Care Visit  Subjective  Diana Stuart is a 30 y.o. G4P1021 at 64w6dbeing seen today for ongoing prenatal care.  She is currently monitored for the following issues for this low-risk pregnancy and has Leiomyoma of uterus, unspecified; Pregnancy with history of cesarean section, antepartum; Supervision of other normal pregnancy, antepartum; History of prior pregnancy with IUGR newborn; and History of cesarean delivery on their problem list.  ----------------------------------------------------------------------------------- Patient reports no complaints.  Her baby is moving well and she denies any problems. Her growth scan at 30 wks show 52%.  Contractions: Not present.  .  Movement: Present. Leaking Fluid denies.  ----------------------------------------------------------------------------------- The following portions of the patient's history were reviewed and updated as appropriate: allergies, current medications, past family history, past medical history, past social history, past surgical history and problem list. Problem list updated.  Objective  Blood pressure 100/60, weight 153 lb (69.4 kg), last menstrual period 03/05/2020, unknown if currently breastfeeding. Pregravid weight 120 lb (54.4 kg) Total Weight Gain 33 lb (15 kg) Urinalysis: Urine Protein Negative  Urine Glucose Negative  Fetal Status:     Movement: Present     General:  Alert, oriented and cooperative. Patient is in no acute distress.  Skin: Skin is warm and dry. No rash noted.   Cardiovascular: Normal heart rate noted  Respiratory: Normal respiratory effort, no problems with respiration noted  Abdomen: Soft, gravid, appropriate for gestational age. Pain/Pressure: Absent     Pelvic:  Cervical exam deferred        Extremities: Normal range of motion.     Mental Status: Normal mood and affect. Normal behavior. Normal judgment and thought content.   Assessment   30y.o. GGI:[id number] 388w6dy   12/06/2020, by Ultrasound presenting for routine prenatal visit  Plan   Pregnancy 4 Problems (from 06/07/20 to present)    Problem Noted Resolved   History of cesarean delivery 08/28/2020 by HaGae DryMD No   Overview Signed 08/28/2020  3:48 PM by HaGae DryMD    OB/GYN  Counseling Note  [age].o. [id number] 2511w5dth Estimated Date of Delivery: 12/06/20 was seen today in office to discuss trial of labor after cesarean section (TOLAC) versus elective repeat cesarean delivery (ERCD). The following risks were discussed with the patient.  Risk of uterine rupture at term is 0.78 percent with TOLAC and 0.22 percent with ERCD. 1 in 10 uterine ruptures will result in neonatal death or neurological injury. The benefits of a trial of labor after cesarean (TOLAC) resulting in a vaginal birth after cesarean (VBAC) include the following: shorter length of hospital stay and postpartum recovery (in most cases); fewer complications, such as postpartum fever, wound or uterine infection, thromboembolism (blood clots in the leg or lung), need for blood transfusion and fewer neonatal breathing problems.  The risks of an attempted VBAC or TOLAC include the following: Risk of failed trial of labor after cesarean (TOLAC) without a vaginal birth after cesarean (VBAC) resulting in repeat cesarean delivery (RCD) in about 20 to 40 70rcent of women who attempt VBAC.  Risk of rupture of uterus resulting in an emergency cesarean delivery. The risk of uterine rupture may be related in part to the type of uterine incision made during the first cesarean delivery. A previous transverse uterine incision has the lowest risk of rupture (0.2 to 1.5 percent risk). Vertical or T-shaped uterine incisions have a higher risk of uterine rupture (4 to 9 percent risk)The risk of fetal  death is very low with both VBAC and elective repeat cesarean delivery (ERCD), but the likelihood of fetal death is higher with VBAC than with ERCD.  Maternal death is very rare with either type of delivery.  The risks of an elective repeat cesarean delivery (ERCD) were reviewed with the patient including but not limited to: 04/998 risk of uterine rupture which could have serious consequences, bleeding which may require transfusion; infection which may require antibiotics; injury to bowel, bladder or other surrounding organs (bowel, bladder, ureters); injury to the fetus; need for additional procedures including hysterectomy in the event of a life-threatening hemorrhage; thromboembolic phenomenon; abnormal placentation; incisional problems; death and other postoperative or anesthesia complications.    Desires R CS      Supervision of other normal pregnancy, antepartum 05/29/2020 by Malachy Mood, MD No   Overview Addendum 08/28/2020  3:49 PM by Gae Dry, MD     Nursing Staff Provider  Office Location  [facility] Dating   14 week [place]  Language  English Anatomy US  Normal female 07/30/2020 [facility]  Flu Vaccine   Received Fall 2021 Genetic Screen  declines  TDaP vaccine    Hgb A1C or  GTT Early : n/a Third trimester :   Rhogam   n/a   LAB RESULTS   Feeding Plan  breast and formula Blood Type A/Positive/-- (03/30 1549)   Contraception  Considering nexplanon at first post op appt Antibody Negative (03/30 1549)  Circumcision  Rubella 6.76 (03/30 1549)  Pediatrician   RPR Non Reactive (03/30 1549)   Support Person  Husband "Joey" HBsAg Negative (03/30 1549)   Prenatal Classes  HIV Non Reactive (03/30 1549)    Varicella  immune  BTL Consent  n/a GBS  (For PCN allergy, check sensitivities)        VBAC Consent  desires R CS Pap  04/29/20 NILM    Hgb Electro      CF      SMA                   Preterm labor symptoms and general obstetric precautions including but not limited to vaginal bleeding, contractions, leaking of fluid and fetal movement were reviewed in detail with the patient. Please refer to After Visit Summary for other  counseling recommendations.   Return in about 2 weeks (around 10/31/2020) for return OB.  [name], CNM  10/17/2020 5:16 PM

## 2020-10-31 ENCOUNTER — Encounter: Payer: Self-pay | Admitting: Advanced Practice Midwife

## 2020-10-31 ENCOUNTER — Other Ambulatory Visit: Payer: Self-pay

## 2020-10-31 ENCOUNTER — Ambulatory Visit (INDEPENDENT_AMBULATORY_CARE_PROVIDER_SITE_OTHER): Payer: Medicaid Other | Admitting: Advanced Practice Midwife

## 2020-10-31 VITALS — BP 110/70 | Wt 148.0 lb

## 2020-10-31 DIAGNOSIS — Z3A34 34 weeks gestation of pregnancy: Secondary | ICD-10-CM

## 2020-10-31 DIAGNOSIS — Z3483 Encounter for supervision of other normal pregnancy, third trimester: Secondary | ICD-10-CM

## 2020-10-31 LAB — POCT URINALYSIS DIPSTICK OB: Glucose, UA: NEGATIVE

## 2020-10-31 NOTE — Progress Notes (Signed)
Routine Prenatal Care Visit  Subjective  Diana Stuart is a 30 y.o. G4P1021 at 70w6dbeing seen today for ongoing prenatal care.  She is currently monitored for the following issues for this low-risk pregnancy and has Leiomyoma of uterus, unspecified; Pregnancy with history of cesarean section, antepartum; Supervision of other normal pregnancy, antepartum; History of prior pregnancy with IUGR newborn; and History of cesarean delivery on their problem list.  ----------------------------------------------------------------------------------- Patient reports  vomiting daily after eating for the past week. She tried bland foods and then tried things she is craving neither of which worked. She has not taken any ant-emetic, however, she still has zofran and will try that. She feels well and energetic otherwise and is taking iron supplement .   Contractions: Not present. Vag. Bleeding: None.  Movement: Present. Leaking Fluid denies.  ----------------------------------------------------------------------------------- The following portions of the patient's history were reviewed and updated as appropriate: allergies, current medications, past family history, past medical history, past social history, past surgical history and problem list. Problem list updated.  Objective  Blood pressure 110/70, weight 148 lb (67.1 kg), last menstrual period 03/05/2020 Pregravid weight 120 lb (54.4 kg) Total Weight Gain 28 lb (12.7 kg) Urinalysis: Urine Protein    Urine Glucose    Fetal Status: Fetal Heart Rate (bpm): 135 Fundal Height: 34 cm Movement: Present     General:  Alert, oriented and cooperative. Patient is in no acute distress.  Skin: Skin is warm and dry. No rash noted.   Cardiovascular: Normal heart rate noted  Respiratory: Normal respiratory effort, no problems with respiration noted  Abdomen: Soft, gravid, appropriate for gestational age. Pain/Pressure: Absent     Pelvic:  Cervical exam deferred         Extremities: Normal range of motion.  Edema: None  Mental Status: Normal mood and affect. Normal behavior. Normal judgment and thought content.   Assessment   30y.o. GGI:4022782at 322w6dy  12/06/2020, by Ultrasound presenting for routine prenatal visit  Plan   Pregnancy 4 Problems (from 06/07/20 to present)     Problem Noted Resolved   History of cesarean delivery 08/28/2020 by HaGae DryMD No   Overview Signed 08/28/2020  3:48 PM by HaGae DryMD    OB/GYN  Counseling Note  [age].o. [id number] 2561w5dth Estimated Date of Delivery: 12/06/20 was seen today in office to discuss trial of labor after cesarean section (TOLAC) versus elective repeat cesarean delivery (ERCD). The following risks were discussed with the patient.  Risk of uterine rupture at term is 0.78 percent with TOLAC and 0.22 percent with ERCD. 1 in 10 uterine ruptures will result in neonatal death or neurological injury. The benefits of a trial of labor after cesarean (TOLAC) resulting in a vaginal birth after cesarean (VBAC) include the following: shorter length of hospital stay and postpartum recovery (in most cases); fewer complications, such as postpartum fever, wound or uterine infection, thromboembolism (blood clots in the leg or lung), need for blood transfusion and fewer neonatal breathing problems.  The risks of an attempted VBAC or TOLAC include the following: Risk of failed trial of labor after cesarean (TOLAC) without a vaginal birth after cesarean (VBAC) resulting in repeat cesarean delivery (RCD) in about 20 to 40 64rcent of women who attempt VBAC.  Risk of rupture of uterus resulting in an emergency cesarean delivery. The risk of uterine rupture may be related in part to the type of uterine incision made during the first cesarean delivery.  A previous transverse uterine incision has the lowest risk of rupture (0.2 to 1.5 percent risk). Vertical or T-shaped uterine incisions have a higher risk of  uterine rupture (4 to 9 percent risk)The risk of fetal death is very low with both VBAC and elective repeat cesarean delivery (ERCD), but the likelihood of fetal death is higher with VBAC than with ERCD. Maternal death is very rare with either type of delivery.  The risks of an elective repeat cesarean delivery (ERCD) were reviewed with the patient including but not limited to: 04/998 risk of uterine rupture which could have serious consequences, bleeding which may require transfusion; infection which may require antibiotics; injury to bowel, bladder or other surrounding organs (bowel, bladder, ureters); injury to the fetus; need for additional procedures including hysterectomy in the event of a life-threatening hemorrhage; thromboembolic phenomenon; abnormal placentation; incisional problems; death and other postoperative or anesthesia complications.    Desires R CS      Supervision of other normal pregnancy, antepartum 05/29/2020 by Malachy Mood, MD No   Overview Addendum 10/17/2020  5:28 PM by [name], CNM     Nursing Staff Provider  Office Location  [facility] Dating   14 week [place]  Language  English Anatomy US  Normal female 07/30/2020 [facility]  Flu Vaccine   Received Fall 2021 Genetic Screen  declines  TDaP vaccine   08/31/2020 Hgb A1C or  GTT Early : n/a Third trimester : 116  Rhogam   n/a   LAB RESULTS   Feeding Plan  breast and formula Blood Type A/Positive/-- (03/30 1549)   Contraception  Considering nexplanon at first post op appt Antibody Negative (03/30 1549)  Circumcision NA Rubella 6.76 (03/30 1549)  Pediatrician   RPR Non Reactive (03/30 1549)   Support Person  Husband "Joey" HBsAg Negative (03/30 1549)   Prenatal Classes  HIV Non Reactive (03/30 1549)    Varicella  immune  BTL Consent  n/a GBS  (For PCN allergy, check sensitivities)        VBAC Consent  desires R CS Pap  04/29/20 NILM    Hgb Electro      CF      SMA                    Preterm labor symptoms  and general obstetric precautions including but not limited to vaginal bleeding, contractions, leaking of fluid and fetal movement were reviewed in detail with the patient.  Repeat c/s is scheduled with L&D on 12/03/20 Surgery scheduling form sent to [facility] Left voicemail for patient regarding surgery date  Return in about 2 weeks (around 11/14/2020) for rob/gbs.  Rod Can, CNM 10/31/2020 4:17 PM

## 2020-10-31 NOTE — Progress Notes (Signed)
ROB- vomiting is back

## 2020-11-14 ENCOUNTER — Other Ambulatory Visit (HOSPITAL_COMMUNITY)
Admission: RE | Admit: 2020-11-14 | Discharge: 2020-11-14 | Disposition: A | Discharge status: Home / Self Care | Payer: Medicaid Other | Source: Ambulatory Visit | Attending: Obstetrics and Gynecology | Admitting: Obstetrics and Gynecology

## 2020-11-14 ENCOUNTER — Ambulatory Visit (INDEPENDENT_AMBULATORY_CARE_PROVIDER_SITE_OTHER): Payer: Medicaid Other | Admitting: Obstetrics and Gynecology

## 2020-11-14 ENCOUNTER — Other Ambulatory Visit: Payer: Self-pay

## 2020-11-14 ENCOUNTER — Encounter: Payer: Self-pay | Admitting: Obstetrics and Gynecology

## 2020-11-14 VITALS — BP 114/70 | Ht 60.0 in | Wt 153.4 lb

## 2020-11-14 DIAGNOSIS — Z3483 Encounter for supervision of other normal pregnancy, third trimester: Secondary | ICD-10-CM

## 2020-11-14 LAB — OB RESULTS CONSOLE GC/CHLAMYDIA: Gonorrhea: NEGATIVE

## 2020-11-14 NOTE — Progress Notes (Signed)
Routine Prenatal Care Visit  Subjective  Diana Stuart is a 30 y.o. G4P1021 at 63w6dbeing seen today for ongoing prenatal care.  She is currently monitored for the following issues for this low-risk pregnancy and has Leiomyoma of uterus, unspecified; Pregnancy with history of cesarean section, antepartum; Supervision of other normal pregnancy, antepartum; History of prior pregnancy with IUGR newborn; and History of cesarean delivery on their problem list.  ----------------------------------------------------------------------------------- Patient reports occasional contractions.   Contractions: Not present. Vag. Bleeding: None.  Movement: Present. Denies leaking of fluid.  ----------------------------------------------------------------------------------- The following portions of the patient's history were reviewed and updated as appropriate: allergies, current medications, past family history, past medical history, past social history, past surgical history and problem list. Problem list updated.   Objective  Blood pressure 114/70, height 5' (1.524 m), weight 153 lb 6.4 oz (69.6 kg), last menstrual period 03/05/2020, unknown if currently breastfeeding. Pregravid weight 120 lb (54.4 kg) Total Weight Gain 33 lb 6.4 oz (15.2 kg) Urinalysis:      Fetal Status: Fetal Heart Rate (bpm): 140 Fundal Height: 36 cm Movement: Present     General:  Alert, oriented and cooperative. Patient is in no acute distress.  Skin: Skin is warm and dry. No rash noted.   Cardiovascular: Normal heart rate noted  Respiratory: Normal respiratory effort, no problems with respiration noted  Abdomen: Soft, gravid, appropriate for gestational age. Pain/Pressure: Absent     Pelvic:  Cervical exam deferred        Extremities: Normal range of motion.  Edema: None  Mental Status: Normal mood and affect. Normal behavior. Normal judgment and thought content.     Assessment   30y.o. [id number] 344w6dy   12/06/2020, by Ultrasound presenting for routine prenatal visit  Plan   Pregnancy 4 Problems (from 06/07/20 to present)     Problem Noted Resolved   History of cesarean delivery 08/28/2020 by HaGae DryMD No   Overview Signed 08/28/2020  3:48 PM by HaGae DryMD    OB/GYN  Counseling Note  [age].o. [id number] 2532w5dth Estimated Date of Delivery: 12/06/20 was seen today in office to discuss trial of labor after cesarean section (TOLAC) versus elective repeat cesarean delivery (ERCD). The following risks were discussed with the patient.  Risk of uterine rupture at term is 0.78 percent with TOLAC and 0.22 percent with ERCD. 1 in 10 uterine ruptures will result in neonatal death or neurological injury. The benefits of a trial of labor after cesarean (TOLAC) resulting in a vaginal birth after cesarean (VBAC) include the following: shorter length of hospital stay and postpartum recovery (in most cases); fewer complications, such as postpartum fever, wound or uterine infection, thromboembolism (blood clots in the leg or lung), need for blood transfusion and fewer neonatal breathing problems.  The risks of an attempted VBAC or TOLAC include the following: Risk of failed trial of labor after cesarean (TOLAC) without a vaginal birth after cesarean (VBAC) resulting in repeat cesarean delivery (RCD) in about 20 to 40 70rcent of women who attempt VBAC.  Risk of rupture of uterus resulting in an emergency cesarean delivery. The risk of uterine rupture may be related in part to the type of uterine incision made during the first cesarean delivery. A previous transverse uterine incision has the lowest risk of rupture (0.2 to 1.5 percent risk). Vertical or T-shaped uterine incisions have a higher risk of uterine rupture (4 to 9 percent risk)The risk of fetal  death is very low with both VBAC and elective repeat cesarean delivery (ERCD), but the likelihood of fetal death is higher with VBAC than with ERCD.  Maternal death is very rare with either type of delivery.  The risks of an elective repeat cesarean delivery (ERCD) were reviewed with the patient including but not limited to: 04/998 risk of uterine rupture which could have serious consequences, bleeding which may require transfusion; infection which may require antibiotics; injury to bowel, bladder or other surrounding organs (bowel, bladder, ureters); injury to the fetus; need for additional procedures including hysterectomy in the event of a life-threatening hemorrhage; thromboembolic phenomenon; abnormal placentation; incisional problems; death and other postoperative or anesthesia complications.    Desires R CS      Supervision of other normal pregnancy, antepartum 05/29/2020 by Malachy Mood, MD No   Overview Addendum 10/17/2020  5:28 PM by [name], CNM     Nursing Staff Provider  Office Location  [facility] Dating   14 week [place]  Language  English Anatomy US  Normal female 07/30/2020 [facility]  Flu Vaccine   Received Fall 2021 Genetic Screen  declines  TDaP vaccine   08/31/2020 Hgb A1C or  GTT Early : n/a Third trimester : 116  Rhogam   n/a   LAB RESULTS   Feeding Plan  breast and formula Blood Type A/Positive/-- (03/30 1549)   Contraception  Considering nexplanon at first post op appt Antibody Negative (03/30 1549)  Circumcision NA Rubella 6.76 (03/30 1549)  Pediatrician   RPR Non Reactive (03/30 1549)   Support Person  Husband "Joey" HBsAg Negative (03/30 1549)   Prenatal Classes  HIV Non Reactive (03/30 1549)    Varicella  immune  BTL Consent  n/a GBS  (For PCN allergy, check sensitivities)        VBAC Consent  desires R CS Pap  04/29/20 NILM    Hgb Electro      CF      SMA                   She has some interest in VBAC- discussed this with her and associated risks. Provided with information from ACOG about VBAC. She was a 64% possibility of success based off MFM calculator    GBS/GC/CT today.   Gestational age  appropriate obstetric precautions including but not limited to vaginal bleeding, contractions, leaking of fluid and fetal movement were reviewed in detail with the patient.    Return in about 1 week (around 11/21/2020) for weekly for 3 visits.  Homero Fellers MD [facility], [facility] 11/14/2020, 12:42 PM

## 2020-11-14 NOTE — Patient Instructions (Addendum)
30 y.o. [id number] at 45w5dwith Estimated Date of Delivery: 12/06/20 was seen today in office to discuss trial of labor after cesarean section (TOLAC) versus elective repeat cesarean delivery (ERCD). The following risks were discussed with the patient.  Risk of uterine rupture at term is 0.78 percent with TOLAC and 0.22 percent with ERCD. 1 in 10 uterine ruptures will result in neonatal death or neurological injury. The benefits of a trial of labor after cesarean (TOLAC) resulting in a vaginal birth after cesarean (VBAC) include the following: shorter length of hospital stay and postpartum recovery (in most cases); fewer complications, such as postpartum fever, wound or uterine infection, thromboembolism (blood clots in the leg or lung), need for blood transfusion and fewer neonatal breathing problems.  The risks of an attempted VBAC or TOLAC include the following: Risk of failed trial of labor after cesarean (TOLAC) without a vaginal birth after cesarean (VBAC) resulting in repeat cesarean delivery (RCD) in about 20 to 428percent of women who attempt VBAC.  Risk of rupture of uterus resulting in an emergency cesarean delivery. The risk of uterine rupture may be related in part to the type of uterine incision made during the first cesarean delivery. A previous transverse uterine incision has the lowest risk of rupture (0.2 to 1.5 percent risk). Vertical or T-shaped uterine incisions have a higher risk of uterine rupture (4 to 9 percent risk)The risk of fetal death is very low with both VBAC and elective repeat cesarean delivery (ERCD), but the likelihood of fetal death is higher with VBAC than with ERCD. Maternal death is very rare with either type of delivery.  The risks of an elective repeat cesarean delivery (ERCD) were reviewed with the patient including but not limited to: 04/998 risk of uterine rupture which could have serious consequences, bleeding which may require transfusion; infection which may  require antibiotics; injury to bowel, bladder or other surrounding organs (bowel, bladder, ureters); injury to the fetus; need for additional procedures including hysterectomy in the event of a life-threatening hemorrhage; thromboembolic phenomenon; abnormal placentation; incisional problems; death and other postoperative or anesthesia complications.     Third Trimester of Pregnancy The third trimester of pregnancy is from week 28 through week 488 This is months 7 through 9. The third trimester is a time when the unborn baby (fetus) is growing rapidly. At the end of the ninth month, the fetus is about 20 inches long and weighs 6-10 pounds. Body changes during your third trimester During the third trimester, your body will continue to go through many changes. The changes vary and generally return to normal after your baby is born. Physical changes Your weight will continue to increase. You can expect to gain 25-35 pounds (11-16 kg) by the end of the pregnancy if you begin pregnancy at a normal weight. If you are underweight, you can expect to gain 28-40 lb (about 13-18 kg), and if you are overweight, you can expect to gain 15-25 lb (about 7-11 kg). You may begin to get stretch marks on your hips, abdomen, and breasts. Your breasts will continue to grow and may hurt. A yellow fluid (colostrum) may leak from your breasts. This is the first milk you are producing for your baby. You may have changes in your hair. These can include thickening of your hair, rapid growth, and changes in texture. Some people also have hair loss during or after pregnancy, or hair that feels dry or thin. Your belly button may stick out. You may notice more  swelling in your hands, face, or ankles. Health changes You may have heartburn. You may have constipation. You may develop hemorrhoids. You may develop swollen, bulging veins (varicose veins) in your legs. You may have increased body aches in the pelvis, back, or thighs.  This is due to weight gain and increased hormones that are relaxing your joints. You may have increased tingling or numbness in your hands, arms, and legs. The skin on your abdomen may also feel numb. You may feel short of breath because of your expanding uterus. Other changes You may urinate more often because the fetus is moving lower into your pelvis and pressing on your bladder. You may have more problems sleeping. This may be caused by the size of your abdomen, an increased need to urinate, and an increase in your body's metabolism. You may notice the fetus "dropping," or moving lower in your abdomen (lightening). You may have increased vaginal discharge. You may notice that you have pain around your pelvic bone as your uterus distends. Follow these instructions at home: Medicines Follow your health care provider's instructions regarding medicine use. Specific medicines may be either safe or unsafe to take during pregnancy. Do not take any medicines unless approved by your health care provider. Take a prenatal vitamin that contains at least 600 micrograms (mcg) of folic acid. Eating and drinking Eat a healthy diet that includes fresh fruits and vegetables, whole grains, good sources of protein such as meat, eggs, or tofu, and low-fat dairy products. Avoid raw meat and unpasteurized juice, milk, and cheese. These carry germs that can harm you and your baby. Eat 4 or 5 small meals rather than 3 large meals a day. You may need to take these actions to prevent or treat constipation: Drink enough fluid to keep your urine pale yellow. Eat foods that are high in fiber, such as beans, whole grains, and fresh fruits and vegetables. Limit foods that are high in fat and processed sugars, such as fried or sweet foods. Activity Exercise only as directed by your health care provider. Most people can continue their usual exercise routine during pregnancy. Try to exercise for 30 minutes at least 5 days a  week. Stop exercising if you experience contractions in the uterus. Stop exercising if you develop pain or cramping in the lower abdomen or lower back. Avoid heavy lifting. Do not exercise if it is very hot or humid or if you are at a high altitude. If you choose to, you may continue to have sex unless your health care provider tells you not to. Relieving pain and discomfort Take frequent breaks and rest with your legs raised (elevated) if you have leg cramps or low back pain. Take warm sitz baths to soothe any pain or discomfort caused by hemorrhoids. Use hemorrhoid cream if your health care provider approves. Wear a supportive bra to prevent discomfort from breast tenderness. If you develop varicose veins: Wear support hose as told by your health care provider. Elevate your feet for 15 minutes, 3-4 times a day. Limit salt in your diet. Safety Talk to your health care provider before traveling far distances. Do not use hot tubs, steam rooms, or saunas. Wear your seat belt at all times when driving or riding in a car. Talk with your health care provider if someone is verbally or physically abusive to you. Preparing for birth To prepare for the arrival of your baby: Take prenatal classes to understand, practice, and ask questions about labor and delivery. Visit the  hospital and tour the maternity area. Purchase a rear-facing car seat and make sure you know how to install it in your car. Prepare the baby's room or sleeping area. Make sure to remove all pillows and stuffed animals from the baby's crib to prevent suffocation. General instructions Avoid cat litter boxes and soil used by cats. These carry germs that can cause birth defects in the baby. If you have a cat, ask someone to clean the litter box for you. Do not douche or use tampons. Do not use scented sanitary pads. Do not use any products that contain nicotine or tobacco, such as cigarettes, e-cigarettes, and chewing tobacco. If you  need help quitting, ask your health care provider. Do not use any herbal remedies, illegal drugs, or medicines that were not prescribed to you. Chemicals in these products can harm your baby. Do not drink alcohol. You will have more frequent prenatal exams during the third trimester. During a routine prenatal visit, your health care provider will do a physical exam, perform tests, and discuss your overall health. Keep all follow-up visits. This is important. Where to find more information American Pregnancy Association: americanpregnancy.[place] and Gynecologists: PoolDevices.com.pt Office on [employer]: KeywordPortfolios.com.br Contact a health care provider if you have: A fever. Mild pelvic cramps, pelvic pressure, or nagging pain in your abdominal area or lower back. Vomiting or diarrhea. Bad-smelling vaginal discharge or foul-smelling urine. Pain when you urinate. A headache that does not go away when you take medicine. Visual changes or see spots in front of your eyes. Get help right away if: Your water breaks. You have regular contractions less than 5 minutes apart. You have spotting or bleeding from your vagina. You have severe abdominal pain. You have difficulty breathing. You have chest pain. You have fainting spells. You have not felt your baby move for the time period told by your health care provider. You have new or increased pain, swelling, or redness in an arm or leg. Summary The third trimester of pregnancy is from week 28 through week 40 (months 7 through 9). You may have more problems sleeping. This can be caused by the size of your abdomen, an increased need to urinate, and an increase in your body's metabolism. You will have more frequent prenatal exams during the third trimester. Keep all follow-up visits. This is important. This information is not intended to replace advice given to you by your health care  provider. Make sure you discuss any questions you have with your health care provider. Document Revised: 07/26/2019 Document Reviewed: 06/01/2019 Elsevier Patient Education  2022 [employer].

## 2020-11-18 LAB — CERVICOVAGINAL ANCILLARY ONLY
Chlamydia: NEGATIVE
Comment: NEGATIVE
Comment: NORMAL
Neisseria Gonorrhea: NEGATIVE

## 2020-11-18 LAB — CULTURE, BETA STREP (GROUP B ONLY): Strep Gp B Culture: NEGATIVE

## 2020-11-19 ENCOUNTER — Other Ambulatory Visit: Payer: Self-pay

## 2020-11-19 ENCOUNTER — Ambulatory Visit (INDEPENDENT_AMBULATORY_CARE_PROVIDER_SITE_OTHER): Payer: Medicaid Other | Admitting: Obstetrics and Gynecology

## 2020-11-19 ENCOUNTER — Encounter: Payer: Self-pay | Admitting: Obstetrics and Gynecology

## 2020-11-19 VITALS — BP 118/74 | Wt 153.0 lb

## 2020-11-19 DIAGNOSIS — O34219 Maternal care for unspecified type scar from previous cesarean delivery: Secondary | ICD-10-CM

## 2020-11-19 DIAGNOSIS — Z8759 Personal history of other complications of pregnancy, childbirth and the puerperium: Secondary | ICD-10-CM

## 2020-11-19 DIAGNOSIS — Z3483 Encounter for supervision of other normal pregnancy, third trimester: Secondary | ICD-10-CM

## 2020-11-19 DIAGNOSIS — Z3A37 37 weeks gestation of pregnancy: Secondary | ICD-10-CM

## 2020-11-19 NOTE — Progress Notes (Signed)
Routine Prenatal Care Visit  Subjective  Diana Stuart is a 30 y.o. G4P1021 at [redacted]w[redacted]d being seen today for ongoing prenatal care.  She is currently monitored for the following issues for this low-risk pregnancy and has Leiomyoma of uterus, unspecified; Pregnancy with history of cesarean section, antepartum; Supervision of other normal pregnancy, antepartum; History of prior pregnancy with IUGR newborn; and History of cesarean delivery on their problem list.  ----------------------------------------------------------------------------------- Patient reports no complaints.   Contractions: Irritability. Vag. Bleeding: None.  Movement: Present. Leaking Fluid denies.  ----------------------------------------------------------------------------------- The following portions of the patient's history were reviewed and updated as appropriate: allergies, current medications, past family history, past medical history, past social history, past surgical history and problem list. Problem list updated.  Objective  Blood pressure 118/74, weight 153 lb (69.4 kg), last menstrual period 03/05/2020, unknown if currently breastfeeding. Pregravid weight 120 lb (54.4 kg) Total Weight Gain 33 lb (15 kg) Urinalysis: Urine Protein    Urine Glucose    Fetal Status: Fetal Heart Rate (bpm): 140 Fundal Height: 37 cm Movement: Present     General:  Alert, oriented and cooperative. Patient is in no acute distress.  Skin: Skin is warm and dry. No rash noted.   Cardiovascular: Normal heart rate noted  Respiratory: Normal respiratory effort, no problems with respiration noted  Abdomen: Soft, gravid, appropriate for gestational age. Pain/Pressure: Absent     Pelvic:  Cervical exam deferred        Extremities: Normal range of motion.  Edema: None  Mental Status: Normal mood and affect. Normal behavior. Normal judgment and thought content.   Assessment   30 y.o. [id number] at [redacted]w[redacted]d by  12/06/2020, by Ultrasound presenting  for routine prenatal visit  Plan   Pregnancy 4 Problems (from 06/07/20 to present)     Problem Noted Resolved   History of cesarean delivery 08/28/2020 by Gae Dry, MD No   Overview Signed 08/28/2020  3:48 PM by Gae Dry, MD    OB/GYN  Counseling Note  30 y.o. [phone] at [redacted]w[redacted]d with Estimated Date of Delivery: 12/06/20 was seen today in office to discuss trial of labor after cesarean section (TOLAC) versus elective repeat cesarean delivery (ERCD). The following risks were discussed with the patient.  Risk of uterine rupture at term is 0.78 percent with TOLAC and 0.22 percent with ERCD. 1 in 10 uterine ruptures will result in neonatal death or neurological injury. The benefits of a trial of labor after cesarean (TOLAC) resulting in a vaginal birth after cesarean (VBAC) include the following: shorter length of hospital stay and postpartum recovery (in most cases); fewer complications, such as postpartum fever, wound or uterine infection, thromboembolism (blood clots in the leg or lung), need for blood transfusion and fewer neonatal breathing problems.  The risks of an attempted VBAC or TOLAC include the following: Risk of failed trial of labor after cesarean (TOLAC) without a vaginal birth after cesarean (VBAC) resulting in repeat cesarean delivery (RCD) in about 20 to 73 percent of women who attempt VBAC.  Risk of rupture of uterus resulting in an emergency cesarean delivery. The risk of uterine rupture may be related in part to the type of uterine incision made during the first cesarean delivery. A previous transverse uterine incision has the lowest risk of rupture (0.2 to 1.5 percent risk). Vertical or T-shaped uterine incisions have a higher risk of uterine rupture (4 to 9 percent risk)The risk of fetal death is very low with both VBAC and elective repeat  cesarean delivery (ERCD), but the likelihood of fetal death is higher with VBAC than with ERCD. Maternal death is very rare with  either type of delivery.  The risks of an elective repeat cesarean delivery (ERCD) were reviewed with the patient including but not limited to: 04/998 risk of uterine rupture which could have serious consequences, bleeding which may require transfusion; infection which may require antibiotics; injury to bowel, bladder or other surrounding organs (bowel, bladder, ureters); injury to the fetus; need for additional procedures including hysterectomy in the event of a life-threatening hemorrhage; thromboembolic phenomenon; abnormal placentation; incisional problems; death and other postoperative or anesthesia complications.    Desires R CS      Supervision of other normal pregnancy, antepartum 05/29/2020 by Malachy Mood, MD No   Overview Addendum 10/17/2020  5:28 PM by [name], CNM     Nursing Staff Provider  Office Location  [facility] Dating   14 week [place]  Language  English Anatomy US  Normal female 07/30/2020 [facility]  Flu Vaccine   Received Fall 2021 Genetic Screen  declines  TDaP vaccine   08/31/2020 Hgb A1C or  GTT Early : n/a Third trimester : 116  Rhogam   n/a   LAB RESULTS   Feeding Plan  breast and formula Blood Type A/Positive/-- (03/30 1549)   Contraception  Considering nexplanon at first post op appt Antibody Negative (03/30 1549)  Circumcision NA Rubella 6.76 (03/30 1549)  Pediatrician   RPR Non Reactive (03/30 1549)   Support Person  Husband "Joey" HBsAg Negative (03/30 1549)   Prenatal Classes  HIV Non Reactive (03/30 1549)    Varicella  immune  BTL Consent  n/a GBS  (For PCN allergy, check sensitivities)        VBAC Consent  desires R CS Pap  04/29/20 NILM    Hgb Electro      CF      SMA                   Term labor symptoms and general obstetric precautions including but not limited to vaginal bleeding, contractions, leaking of fluid and fetal movement were reviewed in detail with the patient. Please refer to After Visit Summary for other counseling  recommendations.   Return in about 1 week (around 11/26/2020) for [place].   Prentice Docker, MD, [facility], [facility] 11/19/2020 5:06 PM

## 2020-11-26 ENCOUNTER — Encounter: Payer: Medicaid Other | Admitting: Obstetrics

## 2020-11-27 ENCOUNTER — Ambulatory Visit (INDEPENDENT_AMBULATORY_CARE_PROVIDER_SITE_OTHER): Payer: Medicaid Other | Admitting: Obstetrics and Gynecology

## 2020-11-27 ENCOUNTER — Encounter: Payer: Self-pay | Admitting: Obstetrics and Gynecology

## 2020-11-27 ENCOUNTER — Inpatient Hospital Stay
Admission: EM | Admit: 2020-11-27 | Discharge: 2020-11-30 | DRG: 787 | Disposition: A | Discharge status: Home / Self Care | Payer: Medicaid Other | Attending: Obstetrics and Gynecology | Admitting: Obstetrics and Gynecology

## 2020-11-27 ENCOUNTER — Other Ambulatory Visit: Payer: Self-pay

## 2020-11-27 VITALS — BP 122/80 | Ht 60.0 in | Wt 153.0 lb

## 2020-11-27 DIAGNOSIS — Z8759 Personal history of other complications of pregnancy, childbirth and the puerperium: Secondary | ICD-10-CM

## 2020-11-27 DIAGNOSIS — D62 Acute posthemorrhagic anemia: Secondary | ICD-10-CM | POA: Diagnosis not present

## 2020-11-27 DIAGNOSIS — Z3A38 38 weeks gestation of pregnancy: Secondary | ICD-10-CM

## 2020-11-27 DIAGNOSIS — O34211 Maternal care for low transverse scar from previous cesarean delivery: Principal | ICD-10-CM | POA: Diagnosis present

## 2020-11-27 DIAGNOSIS — Z20822 Contact with and (suspected) exposure to covid-19: Secondary | ICD-10-CM | POA: Diagnosis present

## 2020-11-27 DIAGNOSIS — Z98891 History of uterine scar from previous surgery: Secondary | ICD-10-CM

## 2020-11-27 DIAGNOSIS — Z3480 Encounter for supervision of other normal pregnancy, unspecified trimester: Secondary | ICD-10-CM

## 2020-11-27 DIAGNOSIS — O479 False labor, unspecified: Secondary | ICD-10-CM | POA: Diagnosis present

## 2020-11-27 DIAGNOSIS — Z3483 Encounter for supervision of other normal pregnancy, third trimester: Secondary | ICD-10-CM

## 2020-11-27 DIAGNOSIS — Z87891 Personal history of nicotine dependence: Secondary | ICD-10-CM

## 2020-11-27 DIAGNOSIS — O9081 Anemia of the puerperium: Secondary | ICD-10-CM | POA: Diagnosis not present

## 2020-11-27 DIAGNOSIS — O34219 Maternal care for unspecified type scar from previous cesarean delivery: Secondary | ICD-10-CM

## 2020-11-27 LAB — RAPID HIV SCREEN (HIV 1/2 AB+AG)
HIV 1/2 Antibodies: NONREACTIVE
HIV-1 P24 Antigen - HIV24: NONREACTIVE

## 2020-11-27 LAB — CBC
HCT: 31.4 % — ABNORMAL LOW (ref 36.0–46.0)
Hemoglobin: 9.7 g/dL — ABNORMAL LOW (ref 12.0–15.0)
MCH: 25.1 pg — ABNORMAL LOW (ref 26.0–34.0)
MCHC: 30.9 g/dL (ref 30.0–36.0)
MCV: 81.3 fL (ref 80.0–100.0)
Platelets: 346 10*3/uL (ref 150–400)
RBC: 3.86 MIL/uL — ABNORMAL LOW (ref 3.87–5.11)
RDW: 14 % (ref 11.5–15.5)
WBC: 10.9 10*3/uL — ABNORMAL HIGH (ref 4.0–10.5)
nRBC: 0 % (ref 0.0–0.2)

## 2020-11-27 LAB — TYPE AND SCREEN
ABO/RH(D): A POS
Antibody Screen: NEGATIVE

## 2020-11-27 LAB — RESP PANEL BY RT-PCR (FLU A&B, COVID) ARPGX2
Influenza A by PCR: NEGATIVE
Influenza B by PCR: NEGATIVE
SARS Coronavirus 2 by RT PCR: NEGATIVE

## 2020-11-27 MED ORDER — BUTORPHANOL TARTRATE 1 MG/ML IJ SOLN
1.0000 mg | INTRAMUSCULAR | Status: DC | PRN
Start: 2020-11-27 — End: 2020-11-28
  Administered 2020-11-27 – 2020-11-28 (×2): 1 mg via INTRAVENOUS
  Filled 2020-11-27: qty 1

## 2020-11-27 MED ORDER — LACTATED RINGERS IV SOLN: INTRAVENOUS | Status: DC

## 2020-11-27 MED ORDER — ACETAMINOPHEN 325 MG PO TABS: 650.0000 mg | ORAL_TABLET | ORAL | Status: DC | PRN

## 2020-11-27 MED ORDER — BUTORPHANOL TARTRATE 1 MG/ML IJ SOLN
INTRAMUSCULAR | Status: AC
  Filled 2020-11-27: qty 1

## 2020-11-27 MED ORDER — TERBUTALINE SULFATE 1 MG/ML IJ SOLN
0.2500 mg | Freq: Once | INTRAMUSCULAR | Status: AC
  Administered 2020-11-27: 0.25 mg via SUBCUTANEOUS
  Filled 2020-11-27: qty 1

## 2020-11-27 MED ORDER — LACTATED RINGERS IV BOLUS
1000.0000 mL | Freq: Once | INTRAVENOUS | Status: AC
  Administered 2020-11-27: 1000 mL via INTRAVENOUS

## 2020-11-27 NOTE — Progress Notes (Signed)
OB History & Physical   History of Present Illness:  Chief Complaint: Pre-operative visit and routine prenatal  HPI:  Diana Stuart is a 30 y.o. G58P1021 female at [redacted]w[redacted]d dated by 14 week ultrasound.  Her pregnancy has been complicated by a history of c-section .    She reports occasional contractions.   She denies leakage of fluid.   She denies vaginal bleeding.   She reports fetal movement.    Total weight gain for pregnancy: 33 lb (15 kg)   Obstetrical Problem List: Pregnancy 4 Problems (from 06/07/20 to present)     Problem Noted Resolved   History of cesarean delivery 08/28/2020 by Gae Dry, MD No   Overview Signed 08/28/2020  3:48 PM by Gae Dry, MD    OB/GYN  Counseling Note  30 y.o. [phone] at [redacted]w[redacted]d with Estimated Date of Delivery: 12/06/20 was seen today in office to discuss trial of labor after cesarean section (TOLAC) versus elective repeat cesarean delivery (ERCD). The following risks were discussed with the patient.  Risk of uterine rupture at term is 0.78 percent with TOLAC and 0.22 percent with ERCD. 1 in 10 uterine ruptures will result in neonatal death or neurological injury. The benefits of a trial of labor after cesarean (TOLAC) resulting in a vaginal birth after cesarean (VBAC) include the following: shorter length of hospital stay and postpartum recovery (in most cases); fewer complications, such as postpartum fever, wound or uterine infection, thromboembolism (blood clots in the leg or lung), need for blood transfusion and fewer neonatal breathing problems.  The risks of an attempted VBAC or TOLAC include the following: Risk of failed trial of labor after cesarean (TOLAC) without a vaginal birth after cesarean (VBAC) resulting in repeat cesarean delivery (RCD) in about 20 to 26 percent of women who attempt VBAC.  Risk of rupture of uterus resulting in an emergency cesarean delivery. The risk of uterine rupture may be related in part to the type of  uterine incision made during the first cesarean delivery. A previous transverse uterine incision has the lowest risk of rupture (0.2 to 1.5 percent risk). Vertical or T-shaped uterine incisions have a higher risk of uterine rupture (4 to 9 percent risk)The risk of fetal death is very low with both VBAC and elective repeat cesarean delivery (ERCD), but the likelihood of fetal death is higher with VBAC than with ERCD. Maternal death is very rare with either type of delivery.  The risks of an elective repeat cesarean delivery (ERCD) were reviewed with the patient including but not limited to: 04/998 risk of uterine rupture which could have serious consequences, bleeding which may require transfusion; infection which may require antibiotics; injury to bowel, bladder or other surrounding organs (bowel, bladder, ureters); injury to the fetus; need for additional procedures including hysterectomy in the event of a life-threatening hemorrhage; thromboembolic phenomenon; abnormal placentation; incisional problems; death and other postoperative or anesthesia complications.    Desires R CS      Supervision of other normal pregnancy, antepartum 05/29/2020 by Malachy Mood, MD No   Overview Addendum 10/17/2020  5:28 PM by [name], CNM     Nursing Staff Provider  Office Location  [facility] Dating   14 week [place]  Language  English Anatomy US  Normal female 07/30/2020 [facility]  Flu Vaccine   Received Fall 2021 Genetic Screen  declines  TDaP vaccine   08/31/2020 Hgb A1C or  GTT Early : n/a Third trimester : 116  Rhogam  n/a   LAB RESULTS   Feeding Plan  breast and formula Blood Type A/Positive/-- (03/30 1549)   Contraception  Considering nexplanon at first post op appt Antibody Negative (03/30 1549)  Circumcision NA Rubella 6.76 (03/30 1549)  Pediatrician   RPR Non Reactive (03/30 1549)   Support Person  Husband "Joey" HBsAg Negative (03/30 1549)   Prenatal Classes  HIV Non Reactive (03/30 1549)     Varicella  immune  BTL Consent  n/a GBS  (For PCN allergy, check sensitivities)        VBAC Consent  desires R CS Pap  04/29/20 NILM    Hgb Electro      CF      SMA                   Maternal Medical History:   Past Medical History:  Diagnosis Date   Miscarriage    Seizure [facility])     Past Surgical History:  Procedure Laterality Date   CESAREAN SECTION N/A 11/10/2018   Procedure: PRIMARY CESAREAN SECTION;  Surgeon: Will Bonnet, MD;  Location: [facility] ORS;  Service: Obstetrics;  Laterality: N/A;  Baby Boy Born @ 1703 Apgars: 8/9 Weight: 8  lbs 14 ozs    Allergies  Allergen Reactions   Grape Flavor [Grape (Artificial) Flavor] Hives    Prior to Admission medications   Medication Sig Start Date End Date Taking? Authorizing Provider  ferrous sulfate 325 (65 FE) MG tablet Take 325 mg by mouth every other day.    [provider]  ondansetron (ZOFRAN-ODT) 4 MG disintegrating tablet Take 1 tablet (4 mg total) by mouth every 8 (eight) hours as needed for nausea or vomiting. Patient not taking: Reported on 11/21/2020 06/10/20   Malachy Mood, MD  Prenatal Vit-Fe Fumarate-FA (MULTIVITAMIN-PRENATAL) 27-0.8 MG TABS tablet Take 1 tablet by mouth daily at 12 noon.    [provider]    OB History  Gravida Para Term Preterm AB Living  4 1 1  0 2 1  SAB IAB Ectopic Multiple Live Births  2 0 0 0 1    # Outcome Date GA Lbr Len/2nd Weight Sex Delivery Anes PTL Lv  4 Current           3 Term 11/10/18 [redacted]w[redacted]d  5 lb 13.8 oz (2.66 kg) M CS-LTranv Spinal  LIV  2 SAB 2019          1 SAB 2018            Prenatal care site: [facility]  Social History: She  reports that she has quit smoking. Her smoking use included cigarettes. She has never used smokeless tobacco. She reports current alcohol use. She reports that she does not use drugs.  Family History: family history includes Breast cancer (age of onset: 30) in her paternal grandmother; Lung cancer (age of  onset: 81) in her maternal grandfather.   Review of Systems:  Review of Systems  Constitutional: Negative.   HENT: Negative.    Eyes: Negative.   Respiratory: Negative.    Cardiovascular: Negative.   Gastrointestinal: Negative.   Genitourinary: Negative.   Musculoskeletal: Negative.   Skin: Negative.   Neurological: Negative.   Psychiatric/Behavioral: Negative.      Physical Exam:  BP 122/80   Ht 5' (1.524 m)   Wt 153 lb (69.4 kg)   LMP 03/05/2020 (Exact Date)   BMI 29.88 kg/m   Physical Exam Constitutional:      General: She is  not in acute distress.    Appearance: Normal appearance. She is well-developed.  HENT:     Head: Normocephalic and atraumatic.  Eyes:     General: No scleral icterus.    Conjunctiva/sclera: Conjunctivae normal.  Cardiovascular:     Rate and Rhythm: Normal rate and regular rhythm.     Heart sounds: No murmur heard.   No friction rub. No gallop.  Pulmonary:     Effort: Pulmonary effort is normal. No respiratory distress.     Breath sounds: Normal breath sounds. No wheezing or rales.  Abdominal:     General: Bowel sounds are normal. There is no distension.     Palpations: Abdomen is soft. There is no mass.     Tenderness: There is no abdominal tenderness. There is no guarding or rebound.  Musculoskeletal:        General: Normal range of motion.     Cervical back: Normal range of motion and neck supple.  Neurological:     General: No focal deficit present.     Mental Status: She is alert and oriented to person, place, and time.     Cranial Nerves: No cranial nerve deficit.  Skin:    General: Skin is warm and dry.     Findings: No erythema.  Psychiatric:        Mood and Affect: Mood normal.        Behavior: Behavior normal.        Judgment: Judgment normal.    Baseline FHR: 145 beats/min     Lab Results  Component Value Date   [place] NEGATIVE 11/08/2018    Assessment:  Diana Stuart is a 30 y.o. G18P1021 female at [redacted]w[redacted]d  with history of c-section, desires repeat.   Plan:  Admit to Labor & Delivery  CBC, T&S, NPO, IVF GBS negative on 11/14/2020   Consents reviewed and signed today. Precautions for labor, fluid leaking, vaginal bleeding, decreased fetal movement given today.   Prentice Docker, MD 11/27/2020 9:29 AM

## 2020-11-27 NOTE — H&P (Signed)
Diana Stuart is a 30 y.o. female presenting for evaluation of painful contractions at 38 weeks 5 days. She is scheduled for a repeat CS  at [redacted] weeks gestation..She denies any leaking of fluid, denies vaginal bleeding, and reports ample fetal movement. She also denies recent Intercourse.  OB History     Gravida  4   Para  1   Term  1   Preterm  0   AB  2   Living  1      SAB  2   IAB  0   Ectopic  0   Multiple  0   Live Births  1          Past Medical History:  Diagnosis Date   Miscarriage    Seizure [facility])    Past Surgical History:  Procedure Laterality Date   CESAREAN SECTION N/A 11/10/2018   Procedure: PRIMARY CESAREAN SECTION;  Surgeon: Diana Bonnet, MD;  Location: [facility] ORS;  Service: Obstetrics;  Laterality: N/A;  Baby Boy Born @ 1703 Apgars: 8/9 Weight: 8  lbs 14 ozs   Family History: family history includes Breast cancer (age of onset: 55) in her paternal grandmother; Lung cancer (age of onset: 57) in her maternal grandfather. Social History:  reports that she has quit smoking. Her smoking use included cigarettes. She has never used smokeless tobacco. She reports that she does not currently use alcohol. She reports that she does not use drugs.     Maternal Diabetes: No Genetic Screening: Normal Maternal Ultrasounds/Referrals: Normal Fetal Ultrasounds or other Referrals:  None Maternal Substance Abuse:  No Significant Maternal Medications:  None Significant Maternal Lab Results:  Group B Strep negative Other Comments:  None  Review of Systems  Constitutional: Negative.   HENT: Negative.    Eyes: Negative.   Respiratory: Negative.    Cardiovascular: Negative.   Gastrointestinal:        Gravid abdomen. IUP 38 weeks 5 days, regular contractions today, now moderately painful  Endocrine: Negative.   Genitourinary: Negative.   Musculoskeletal: Negative.   Allergic/Immunologic: Negative.   Neurological: Negative.   Hematological: Negative.    Psychiatric/Behavioral: Negative.    History Dilation: Closed Exam by:: Diana Stuart Blood pressure 117/69, pulse 86, temperature 98.9 F (37.2 C), temperature source Oral, resp. rate 16, height 5' (1.524 m), weight 69.4 kg, last menstrual period 03/05/2020, unknown if currently breastfeeding. Maternal Exam:  Uterine Assessment: Contraction strength is moderate.  Contraction duration is 60 minutes. Contraction frequency is regular.  Abdomen: Patient reports no abdominal tenderness. Surgical scars: low transverse.   Fetal presentation: vertex Introitus: Normal vulva. Normal vagina.  Cervix: Cervix evaluated by digital exam.    Physical Exam Constitutional:      Appearance: Normal appearance. She is normal weight.  HENT:     Head: Normocephalic and atraumatic.     Nose: Nose normal.  Cardiovascular:     Rate and Rhythm: Normal rate and regular rhythm.     Pulses: Normal pulses.     Heart sounds: Normal heart sounds.  Pulmonary:     Effort: Pulmonary effort is normal.     Breath sounds: Normal breath sounds.  Abdominal:     General: Bowel sounds are normal.     Comments: Gravid abdomen.   Genitourinary:    General: Normal vulva.     Rectum: Normal.     Comments: SVE: closed/50%/ballotable Cervix is posterior Musculoskeletal:        General: Normal range of motion.  Cervical back: Normal range of motion and neck supple.  Skin:    General: Skin is warm and dry.  Neurological:     General: No focal deficit present.     Mental Status: She is alert and oriented to person, place, and time.  Psychiatric:        Mood and Affect: Mood normal.        Behavior: Behavior normal.    Prenatal labs: ABO, Rh: --/--/A POS (09/28 1832) Antibody: NEG (09/28 1832) Rubella: 6.76 (03/30 1549) RPR: Non Reactive (08/01 1546)  HBsAg: Negative (03/30 1549)  HIV: NON REACTIVE (09/28 1832)  GBS: Negative/-- (09/15 1240)   Assessment/Plan: IUP 38 weeks 5 days Hx of previous Cesarean  Section- planning repeat CS at 39 weeks. Category 1 FHTS Closed cervix despite regular contraction pattern- no cervical change over several hours Plan_ IV fluid hydration Clear liquids. EFM Plan of care discussed with Dr. Glennon Stuart. Diana monitor overnight and recheck in the morning or PRN. Diana proceed with repeat CS for any cervical change of change in fetal tracing.  Diana Stuart, CNM  11/27/2020 10:37 PM      Diana Stuart Diana Stuart 11/27/2020, 10:24 PM

## 2020-11-27 NOTE — OB Triage Note (Signed)
Pt reports for ctx since 10am this morning that have got stronger. Pt reports ctx in back/abd rates 7/10. +FM. Denies LOF/bleeding. VSS. Monitors applied.

## 2020-11-28 ENCOUNTER — Inpatient Hospital Stay: Admission: RE | Admit: 2020-11-28 | Payer: Medicaid Other | Source: Ambulatory Visit

## 2020-11-28 ENCOUNTER — Encounter
Admission: EM | Disposition: A | Discharge status: Home / Self Care | Payer: Self-pay | Source: Home / Self Care | Attending: Obstetrics and Gynecology

## 2020-11-28 ENCOUNTER — Inpatient Hospital Stay: Payer: Medicaid Other | Admitting: Anesthesiology

## 2020-11-28 ENCOUNTER — Encounter: Payer: Self-pay | Admitting: Obstetrics and Gynecology

## 2020-11-28 DIAGNOSIS — D649 Anemia, unspecified: Secondary | ICD-10-CM | POA: Diagnosis not present

## 2020-11-28 DIAGNOSIS — Z3A38 38 weeks gestation of pregnancy: Secondary | ICD-10-CM

## 2020-11-28 DIAGNOSIS — Z20822 Contact with and (suspected) exposure to covid-19: Secondary | ICD-10-CM | POA: Diagnosis present

## 2020-11-28 DIAGNOSIS — O9081 Anemia of the puerperium: Secondary | ICD-10-CM | POA: Diagnosis not present

## 2020-11-28 DIAGNOSIS — O34211 Maternal care for low transverse scar from previous cesarean delivery: Secondary | ICD-10-CM | POA: Diagnosis present

## 2020-11-28 DIAGNOSIS — Z87891 Personal history of nicotine dependence: Secondary | ICD-10-CM | POA: Diagnosis not present

## 2020-11-28 DIAGNOSIS — O26893 Other specified pregnancy related conditions, third trimester: Secondary | ICD-10-CM | POA: Diagnosis present

## 2020-11-28 DIAGNOSIS — D62 Acute posthemorrhagic anemia: Secondary | ICD-10-CM | POA: Diagnosis not present

## 2020-11-28 LAB — RPR: RPR Ser Ql: NONREACTIVE

## 2020-11-28 SURGERY — Surgical Case
Anesthesia: Spinal

## 2020-11-28 MED ORDER — BUPIVACAINE 0.5 % ON-Q PUMP DUAL CATH 400 ML: 400.0000 mL | INJECTION | Status: DC

## 2020-11-28 MED ORDER — BUPIVACAINE HCL (PF) 0.5 % IJ SOLN
INTRAMUSCULAR | Status: DC | PRN
  Administered 2020-11-28: 30 mL

## 2020-11-28 MED ORDER — DIBUCAINE (PERIANAL) 1 % EX OINT: 1.0000 "application " | TOPICAL_OINTMENT | CUTANEOUS | Status: DC | PRN

## 2020-11-28 MED ORDER — ONDANSETRON HCL 4 MG/2ML IJ SOLN: 4.0000 mg | Freq: Three times a day (TID) | INTRAMUSCULAR | Status: DC | PRN

## 2020-11-28 MED ORDER — SODIUM CHLORIDE 0.9% FLUSH: 3.0000 mL | INTRAVENOUS | Status: DC | PRN

## 2020-11-28 MED ORDER — DIPHENHYDRAMINE HCL 25 MG PO CAPS: 25.0000 mg | ORAL_CAPSULE | ORAL | Status: DC | PRN

## 2020-11-28 MED ORDER — PHENYLEPHRINE HCL (PRESSORS) 10 MG/ML IV SOLN
INTRAVENOUS | Status: DC | PRN
  Administered 2020-11-28: 150 ug via INTRAVENOUS

## 2020-11-28 MED ORDER — DEXAMETHASONE SODIUM PHOSPHATE 10 MG/ML IJ SOLN
INTRAMUSCULAR | Status: DC | PRN
  Administered 2020-11-28: 10 mg via INTRAVENOUS

## 2020-11-28 MED ORDER — BUPIVACAINE HCL (PF) 0.5 % IJ SOLN: 20.0000 mL | INTRAMUSCULAR | Status: DC

## 2020-11-28 MED ORDER — SIMETHICONE 80 MG PO CHEW
80.0000 mg | CHEWABLE_TABLET | Freq: Three times a day (TID) | ORAL | Status: DC
  Administered 2020-11-29 – 2020-11-30 (×4): 80 mg via ORAL
  Filled 2020-11-28 (×4): qty 1

## 2020-11-28 MED ORDER — BUPIVACAINE IN DEXTROSE 0.75-8.25 % IT SOLN
INTRATHECAL | Status: DC | PRN
  Administered 2020-11-28: 1.6 mL via INTRATHECAL

## 2020-11-28 MED ORDER — KETOROLAC TROMETHAMINE 30 MG/ML IJ SOLN
INTRAMUSCULAR | Status: DC | PRN
Start: 2020-11-28 — End: 2020-11-28
  Administered 2020-11-28: 30 mg via INTRAVENOUS

## 2020-11-28 MED ORDER — BUPIVACAINE 0.25 % ON-Q PUMP DUAL CATH 400 ML
400.0000 mL | INJECTION | Status: DC
  Filled 2020-11-28: qty 400

## 2020-11-28 MED ORDER — SENNOSIDES-DOCUSATE SODIUM 8.6-50 MG PO TABS
2.0000 | ORAL_TABLET | ORAL | Status: DC
  Administered 2020-11-29: 2 via ORAL
  Filled 2020-11-28: qty 2

## 2020-11-28 MED ORDER — WITCH HAZEL-GLYCERIN EX PADS: 1.0000 "application " | MEDICATED_PAD | CUTANEOUS | Status: DC | PRN

## 2020-11-28 MED ORDER — IBUPROFEN 600 MG PO TABS: 600.0000 mg | ORAL_TABLET | Freq: Four times a day (QID) | ORAL | Status: DC

## 2020-11-28 MED ORDER — SOD CITRATE-CITRIC ACID 500-334 MG/5ML PO SOLN
ORAL | Status: AC
  Filled 2020-11-28: qty 15

## 2020-11-28 MED ORDER — KETOROLAC TROMETHAMINE 30 MG/ML IJ SOLN
30.0000 mg | Freq: Four times a day (QID) | INTRAMUSCULAR | Status: DC | PRN
Start: 2020-11-28 — End: 2020-11-29

## 2020-11-28 MED ORDER — MENTHOL 3 MG MT LOZG
1.0000 | LOZENGE | OROMUCOSAL | Status: DC | PRN
  Filled 2020-11-28: qty 9

## 2020-11-28 MED ORDER — FENTANYL CITRATE (PF) 100 MCG/2ML IJ SOLN
INTRAMUSCULAR | Status: DC | PRN
  Administered 2020-11-28: 15 ug via INTRATHECAL

## 2020-11-28 MED ORDER — OXYTOCIN-SODIUM CHLORIDE 30-0.9 UT/500ML-% IV SOLN
2.5000 [IU]/h | INTRAVENOUS | Status: AC
  Administered 2020-11-28: 2.5 [IU]/h via INTRAVENOUS
  Filled 2020-11-28: qty 500

## 2020-11-28 MED ORDER — NALOXONE HCL 0.4 MG/ML IJ SOLN: 0.4000 mg | INTRAMUSCULAR | Status: DC | PRN

## 2020-11-28 MED ORDER — KETOROLAC TROMETHAMINE 30 MG/ML IJ SOLN
30.0000 mg | Freq: Four times a day (QID) | INTRAMUSCULAR | Status: DC | PRN
Start: 2020-11-28 — End: 2020-11-29
  Administered 2020-11-29: 30 mg via INTRAVENOUS

## 2020-11-28 MED ORDER — OXYTOCIN-SODIUM CHLORIDE 30-0.9 UT/500ML-% IV SOLN
INTRAVENOUS | Status: AC
  Filled 2020-11-28: qty 500

## 2020-11-28 MED ORDER — OXYCODONE HCL 5 MG PO TABS: 5.0000 mg | ORAL_TABLET | ORAL | Status: DC | PRN

## 2020-11-28 MED ORDER — FENTANYL CITRATE (PF) 100 MCG/2ML IJ SOLN
INTRAMUSCULAR | Status: AC
  Filled 2020-11-28: qty 2

## 2020-11-28 MED ORDER — KETOROLAC TROMETHAMINE 30 MG/ML IJ SOLN
30.0000 mg | Freq: Four times a day (QID) | INTRAMUSCULAR | Status: DC
  Filled 2020-11-28: qty 1

## 2020-11-28 MED ORDER — LACTATED RINGERS IV SOLN: INTRAVENOUS | Status: DC

## 2020-11-28 MED ORDER — CEFAZOLIN SODIUM-DEXTROSE 2-4 GM/100ML-% IV SOLN: 2.0000 g | INTRAVENOUS | Status: DC

## 2020-11-28 MED ORDER — MORPHINE SULFATE (PF) 0.5 MG/ML IJ SOLN
INTRAMUSCULAR | Status: AC
  Filled 2020-11-28: qty 10

## 2020-11-28 MED ORDER — SOD CITRATE-CITRIC ACID 500-334 MG/5ML PO SOLN
30.0000 mL | ORAL | Status: AC
  Administered 2020-11-28: 30 mL via ORAL

## 2020-11-28 MED ORDER — NALBUPHINE HCL 10 MG/ML IJ SOLN: 5.0000 mg | Freq: Once | INTRAMUSCULAR | Status: DC | PRN

## 2020-11-28 MED ORDER — NALOXONE HCL 4 MG/10ML IJ SOLN
1.0000 ug/kg/h | INTRAVENOUS | Status: DC | PRN
  Filled 2020-11-28: qty 5

## 2020-11-28 MED ORDER — DIPHENHYDRAMINE HCL 50 MG/ML IJ SOLN: 12.5000 mg | INTRAMUSCULAR | Status: DC | PRN

## 2020-11-28 MED ORDER — BUPIVACAINE HCL (PF) 0.5 % IJ SOLN: 5.0000 mL | Freq: Once | INTRAMUSCULAR | Status: DC

## 2020-11-28 MED ORDER — OXYCODONE-ACETAMINOPHEN 5-325 MG PO TABS: 1.0000 | ORAL_TABLET | ORAL | Status: DC | PRN

## 2020-11-28 MED ORDER — NALBUPHINE HCL 10 MG/ML IJ SOLN: 5.0000 mg | INTRAMUSCULAR | Status: DC | PRN

## 2020-11-28 MED ORDER — OXYTOCIN-SODIUM CHLORIDE 30-0.9 UT/500ML-% IV SOLN
INTRAVENOUS | Status: DC | PRN
  Administered 2020-11-28: 300 mL via INTRAVENOUS

## 2020-11-28 MED ORDER — COCONUT OIL OIL
1.0000 "application " | TOPICAL_OIL | Status: DC | PRN
  Filled 2020-11-28: qty 120

## 2020-11-28 MED ORDER — PRENATAL MULTIVITAMIN CH
1.0000 | ORAL_TABLET | Freq: Every day | ORAL | Status: DC
  Administered 2020-11-29: 1 via ORAL
  Filled 2020-11-28: qty 1

## 2020-11-28 MED ORDER — SOD CITRATE-CITRIC ACID 500-334 MG/5ML PO SOLN: 30.0000 mL | ORAL | Status: DC

## 2020-11-28 MED ORDER — SODIUM CHLORIDE 0.9 % IV SOLN
INTRAVENOUS | Status: DC | PRN
  Administered 2020-11-28: 50 ug/min via INTRAVENOUS

## 2020-11-28 MED ORDER — CEFAZOLIN SODIUM-DEXTROSE 2-4 GM/100ML-% IV SOLN
2.0000 g | INTRAVENOUS | Status: AC
  Administered 2020-11-28: 2 g via INTRAVENOUS
  Filled 2020-11-28: qty 100

## 2020-11-28 MED ORDER — NALBUPHINE HCL 10 MG/ML IJ SOLN
5.0000 mg | INTRAMUSCULAR | Status: DC | PRN
Start: 2020-11-28 — End: 2020-11-30

## 2020-11-28 MED ORDER — DIPHENHYDRAMINE HCL 25 MG PO CAPS
25.0000 mg | ORAL_CAPSULE | Freq: Four times a day (QID) | ORAL | Status: DC | PRN
  Administered 2020-11-28: 25 mg via ORAL
  Filled 2020-11-28: qty 1

## 2020-11-28 MED ORDER — MORPHINE SULFATE (PF) 0.5 MG/ML IJ SOLN
INTRAMUSCULAR | Status: DC | PRN
  Administered 2020-11-28: .1 mg via INTRATHECAL

## 2020-11-28 MED ORDER — ONDANSETRON HCL 4 MG/2ML IJ SOLN
INTRAMUSCULAR | Status: DC | PRN
  Administered 2020-11-28 (×2): 4 mg via INTRAVENOUS

## 2020-11-28 MED ORDER — BUPIVACAINE 0.25 % ON-Q PUMP DUAL CATH 400 ML
400.0000 mL | INJECTION | Status: DC
  Filled 2020-11-28 (×2): qty 400

## 2020-11-28 MED ORDER — SIMETHICONE 80 MG PO CHEW: 80.0000 mg | CHEWABLE_TABLET | ORAL | Status: DC | PRN

## 2020-11-28 SURGICAL SUPPLY — 30 items
BAG COUNTER SPONGE SURGICOUNT (BAG) ×2 IMPLANT
CATH KIT ON-Q SILVERSOAK 5IN (CATHETERS) ×4 IMPLANT
CHLORAPREP W/TINT 26 (MISCELLANEOUS) ×4 IMPLANT
DERMABOND ADVANCED (GAUZE/BANDAGES/DRESSINGS) ×1
DERMABOND ADVANCED .7 DNX12 (GAUZE/BANDAGES/DRESSINGS) ×1 IMPLANT
DRSG OPSITE POSTOP 4X10 (GAUZE/BANDAGES/DRESSINGS) ×2 IMPLANT
DRSG TELFA 3X8 NADH (GAUZE/BANDAGES/DRESSINGS) ×2 IMPLANT
ELECT CAUTERY BLADE 6.4 (BLADE) ×2 IMPLANT
ELECT REM PT RETURN 9FT ADLT (ELECTROSURGICAL) ×2
ELECTRODE REM PT RTRN 9FT ADLT (ELECTROSURGICAL) ×1 IMPLANT
GAUZE SPONGE 4X4 12PLY STRL (GAUZE/BANDAGES/DRESSINGS) ×2 IMPLANT
GLOVE SURG ENC MOIS LTX SZ7 (GLOVE) ×2 IMPLANT
GLOVE SURG UNDER LTX SZ7.5 (GLOVE) ×2 IMPLANT
GOWN STRL REUS W/ TWL LRG LVL3 (GOWN DISPOSABLE) ×3 IMPLANT
GOWN STRL REUS W/TWL LRG LVL3 (GOWN DISPOSABLE) ×3
MANIFOLD NEPTUNE II (INSTRUMENTS) ×2 IMPLANT
MAT PREVALON FULL STRYKER (MISCELLANEOUS) ×2 IMPLANT
NS IRRIG 1000ML POUR BTL (IV SOLUTION) ×2 IMPLANT
PACK C SECTION AR (MISCELLANEOUS) ×2 IMPLANT
PAD OB MATERNITY 4.3X12.25 (PERSONAL CARE ITEMS) ×2 IMPLANT
PAD PREP 24X41 OB/GYN DISP (PERSONAL CARE ITEMS) ×2 IMPLANT
PENCIL SMOKE EVACUATOR (MISCELLANEOUS) ×2 IMPLANT
SCRUB EXIDINE 4% CHG 4OZ (MISCELLANEOUS) ×2 IMPLANT
STRIP CLOSURE SKIN 1/2X4 (GAUZE/BANDAGES/DRESSINGS) ×2 IMPLANT
SUT MNCRL AB 4-0 PS2 18 (SUTURE) ×2 IMPLANT
SUT PDS AB 1 TP1 96 (SUTURE) ×4 IMPLANT
SUT VIC AB 0 CTX 36 (SUTURE) ×2
SUT VIC AB 0 CTX36XBRD ANBCTRL (SUTURE) ×2 IMPLANT
SUT VIC AB 2-0 CT1 36 (SUTURE) ×2 IMPLANT
WATER STERILE IRR 500ML POUR (IV SOLUTION) ×2 IMPLANT

## 2020-11-28 NOTE — Progress Notes (Signed)
Pt. coping with ctx pain by bouncing on birthing ball in triage room since 0857. Despite multiple attempts by RN to trace baby and reposition [place] monitor, baby remains difficult to trace due to maternal coping measures. When [place] does pick up FHR, the tracing is reactive. RN will continue to try to reposition [place] when available.

## 2020-11-28 NOTE — Anesthesia Procedure Notes (Signed)
Spinal  Patient location during procedure: OR Start time: 11/28/2020 1:43 PM End time: 11/28/2020 1:46 PM Reason for block: surgical anesthesia Staffing Anesthesiologist: Martha Clan, MD Resident/CRNA: Daryel Gerald, RN Preanesthetic Checklist Completed: patient identified, IV checked, site marked, risks and benefits discussed, surgical consent, monitors and equipment checked, pre-op evaluation and timeout performed Spinal Block Patient position: sitting Prep: Betadine Patient monitoring: heart rate, continuous pulse ox, blood pressure and cardiac monitor Approach: midline Location: L4-5 Injection technique: single-shot Needle Needle type: Whitacre and Introducer  Needle gauge: 24 G Needle length: 9 cm Assessment Sensory level: T6 Events: CSF return Additional Notes Negative paresthesia. Negative blood return. Positive free-flowing CSF. Expiration date of kit checked and confirmed. Patient tolerated procedure well, without complications.

## 2020-11-28 NOTE — Anesthesia Preprocedure Evaluation (Signed)
Anesthesia Evaluation  Patient identified by MRN, date of birth, ID band Patient awake    Reviewed: Allergy & Precautions, NPO status , Patient's Chart, lab work & pertinent test results, reviewed documented beta blocker date and time   History of Anesthesia Complications Negative for: history of anesthetic complications  Airway Mallampati: III  TM Distance: >3 FB     Dental  (+) Dental Advidsory Given, Teeth Intact   Pulmonary neg pulmonary ROS, former smoker,           Cardiovascular Exercise Tolerance: Good negative cardio ROS       Neuro/Psych Seizures -, Well Controlled,     GI/Hepatic negative GI ROS, Neg liver ROS,   Endo/Other  negative endocrine ROS  Renal/GU      Musculoskeletal   Abdominal   Peds  Hematology  (+) Blood dyscrasia, anemia ,   Anesthesia Other Findings Past Medical History: No date: Miscarriage No date: Seizure ([place])   Reproductive/Obstetrics (+) Pregnancy                             Anesthesia Physical  Anesthesia Plan  ASA: 2  Anesthesia Plan: Spinal   Post-op Pain Management:    Induction:   PONV Risk Score and Plan:   Airway Management Planned: Natural Airway  Additional Equipment:   Intra-op Plan:   Post-operative Plan:   Informed Consent: I have reviewed the patients History and Physical, chart, labs and discussed the procedure including the risks, benefits and alternatives for the proposed anesthesia with the patient or authorized representative who has indicated his/her understanding and acceptance.       Plan Discussed with: CRNA  Anesthesia Plan Comments:         Anesthesia Quick Evaluation

## 2020-11-28 NOTE — Discharge Summary (Signed)
OB Discharge Summary     Patient Name: Diana Stuart DOB: 11-Jun-1990 MRN: [id number]  Date of admission: 11/27/2020 Delivering MD: Malachy Mood   Date of discharge: 11/30/2020  Admitting diagnosis: Irregular uterine contractions [O47.9] History of cesarean delivery [Z98.891] Intrauterine pregnancy: [redacted]w[redacted]d     Secondary diagnosis:  Active Problems:   History of cesarean delivery   Irregular uterine contractions  Additional problems: Anemia     Discharge diagnosis: Term Pregnancy Delivered                                                                                                Post partum procedures: none  Augmentation: N/A  Complications: None  Hospital course:  Onset of Labor With Unplanned C/S   30 y.o. yo [id number] at [redacted]w[redacted]d was admitted in Latent Labor on 11/27/2020. Patient had a labor course significant for history of prior cesarean section with pain out of proportion concerning for possible uterine rupture. The patient went for cesarean section due to Elective Repeat. Delivery details as follows: Membrane Rupture Time/Date: 2:07 PM ,11/28/2020   Delivery Method:C-Section, Low Transverse  Details of operation can be found in separate operative note. Patient had an uncomplicated postpartum course.  She is ambulating,tolerating a regular diet, passing flatus, and urinating well.  Patient is discharged home in stable condition 11/30/20.  Newborn Data: Birth date:11/28/2020  Birth time:2:08 PM  Gender:Female  Living status:Living  Apgars:9 ,9  Weight:3170 g   Physical exam  Vitals:   11/29/20 1156 11/29/20 1920 11/29/20 2308 11/30/20 0753  BP: 107/74 127/79 125/78 122/86  Pulse: 76 88 78 87  Resp: 18 18 18 18   Temp: 98.5 F (36.9 C) 98.1 F (36.7 C) 98.7 F (37.1 C) 98.2 F (36.8 C)  TempSrc: Oral Oral Oral Oral  SpO2:  100% 100% 99%  Weight:      Height:       General: alert, cooperative, and no distress Lochia: appropriate Uterine Fundus:  firm Incision: Healing well with no significant drainage DVT Evaluation: No evidence of DVT seen on physical exam. Labs: Lab Results  Component Value Date   WBC 16.5 (H) 11/29/2020   HGB 8.9 (L) 11/29/2020   HCT 26.8 (L) 11/29/2020   MCV 82.2 11/29/2020   PLT 307 11/29/2020   No flowsheet data found.  Discharge instruction: per After Visit Summary and "Baby and Me Booklet".  After visit meds:  Allergies as of 11/30/2020       Reactions   Grape Flavor [grape (artificial) Flavor] Hives        Medication List     TAKE these medications    acetaminophen 500 MG tablet Commonly known as: TYLENOL Take 2 tablets (1,000 mg total) by mouth every 6 (six) hours as needed.   docusate sodium 100 MG capsule Commonly known as: COLACE Take 1 capsule (100 mg total) by mouth 2 (two) times daily as needed.   ferrous sulfate 325 (65 FE) MG tablet Take 325 mg by mouth every other day.   ibuprofen 600 MG tablet Commonly known as: ADVIL Take 1 tablet (600 mg total)  by mouth every 6 (six) hours.   multivitamin-prenatal 27-0.8 MG Tabs tablet Take 1 tablet by mouth daily at 12 noon.   ondansetron 4 MG disintegrating tablet Commonly known as: ZOFRAN-ODT Take 1 tablet (4 mg total) by mouth every 8 (eight) hours as needed for nausea or vomiting.   oxyCODONE 5 MG immediate release tablet Commonly known as: Roxicodone Take 1 tablet (5 mg total) by mouth every 6 (six) hours as needed.               Discharge Care Instructions  (From admission, onward)           Start     Ordered   11/30/20 0000  Discharge wound care:       Comments: SHOWER DAILY Wash incision gently with soap and water.  Call office with any drainage, redness, or firmness of the incision.   11/30/20 1021            Diet: routine diet  Activity: Advance as tolerated. Pelvic rest for 6 weeks.   Outpatient follow up:1 week Follow up Appt: Future Appointments  Date Time Provider [place]   12/02/2020  8:05 AM [facility] None  12/06/2020  4:30 PM Malachy Mood, MD [facility] None   Follow up Visit:No follow-ups on file.  Postpartum contraception: Nexplanon  Newborn Data: Live born female  Birth Weight: 6 lb 15.8 oz (3170 g) APGAR: 51, 9  Newborn Delivery   Birth date/time: 11/28/2020 14:08:00 Delivery type: C-Section, Low Transverse Trial of labor: No C-section categorization: Repeat      Baby Feeding: Bottle Disposition:home with mother   11/30/2020 Homero Fellers, MD

## 2020-11-28 NOTE — Progress Notes (Signed)
Subjective:  Continues to painfully contract Objective:   Vitals: Blood pressure 104/71, pulse 79, temperature 97.7 F (36.5 C), temperature source Oral, resp. rate 15, height 5' (1.524 m), weight 69.4 kg, last menstrual period 03/05/2020, SpO2 100 %, unknown if currently breastfeeding. General: NAD Abdomen: gravid, non-tender Cervical Exam:  Dilation: Closed Exam by:: Georgianne Fick, MD  FHT: 140, moderate, +accles, no decels Toco: q2-50min  Results for orders placed or performed during the hospital encounter of 11/27/20 (from the past 24 hour(s))  Resp Panel by RT-PCR (Flu A&B, Covid) Nasopharyngeal Swab     Status: None   Collection Time: 11/27/20  6:32 PM   Specimen: Nasopharyngeal Swab; Nasopharyngeal(NP) swabs in vial transport medium  Result Value Ref Range   SARS Coronavirus 2 by RT PCR NEGATIVE NEGATIVE   Influenza A by PCR NEGATIVE NEGATIVE   Influenza B by PCR NEGATIVE NEGATIVE  CBC     Status: Abnormal   Collection Time: 11/27/20  6:32 PM  Result Value Ref Range   WBC 10.9 (H) 4.0 - 10.5 K/uL   RBC 3.86 (L) 3.87 - 5.11 MIL/uL   Hemoglobin 9.7 (L) 12.0 - 15.0 g/dL   HCT 31.4 (L) 36.0 - 46.0 %   MCV 81.3 80.0 - 100.0 fL   MCH 25.1 (L) 26.0 - 34.0 pg   MCHC 30.9 30.0 - 36.0 g/dL   RDW 14.0 11.5 - 15.5 %   Platelets 346 150 - 400 K/uL   nRBC 0.0 0.0 - 0.2 %  RPR     Status: None   Collection Time: 11/27/20  6:32 PM  Result Value Ref Range   RPR Ser Ql NON REACTIVE NON REACTIVE  Rapid HIV screen (HIV 1/2 Ab+Ag)     Status: None   Collection Time: 11/27/20  6:32 PM  Result Value Ref Range   HIV-1 P24 Antigen - HIV24 NON REACTIVE NON REACTIVE   HIV 1/2 Antibodies NON REACTIVE NON REACTIVE   Interpretation (HIV Ag Ab)      A non reactive test result means that HIV 1 or HIV 2 antibodies and HIV 1 p24 antigen were not detected in the specimen.  Type and screen [place]     Status: None   Collection Time: 11/27/20  6:32 PM  Result Value Ref Range    ABO/RH(D) A POS    Antibody Screen NEG    Sample Expiration      11/30/2020,2359 Performed at [facility], [address] [place], [place] [address]     Assessment:   30 y.o. [phone] [redacted]w[redacted]d with contractions in setting of prior cesarean   Plan:   1) Labor - continues to contract with pain out of proportion, reassuing fetal monitorin but concern in setting of prior cesarean scar.  Will proceed with repeat LTCS.  The patient was counseled regarding risk and benefits to proceeding with Cesarean section to expedite delivery.  Risk of cesarean section were discussed including risk of bleeding and need for potential intraoperative or postoperative blood transfusion with a rate of approximately 5% quoted for all Cesarean sections, risk of injury to adjacent organs including but not limited to bowl and bladder, the need for additional surgical procedures to address such injuries, and the risk of infection.   After consideration of options the patient is amenable to proceed with primary cesarean section for delivery.   2) Fetus - cat I tracing  Malachy Mood, MD, [facility] 11/28/2020, 12:07 PM

## 2020-11-28 NOTE — Op Note (Signed)
Preoperative Diagnosis: 1) 30 y.o. [id number] at [redacted]w[redacted]d with history of prior cesaran section 2) Contractions   Postoperative Diagnosis: 1) 30 y.o. [id number] at [redacted]w[redacted]d with history of prior cesaran section 2) Contractions   Operation Performed: Repeat low transverse C-section via pfannenstiel skin incision  Indiciation: Patient present with regular uterine contractions and abdominal pain out of proportion.  Concern for possible uterine scar rupture in setting of prior cesarean section  Anesthesia: Spinal  Primary Surgeon: Malachy Mood, MD   Assistant:Stephen Glennon Mac, MD this surgery required a high level surgical assistant with none other readily available  Preoperative Antibiotics: 2g Ancef  Estimated Blood Loss: 349mL  IV Fluids: 767mL  Urine Output:: 60mL  Drains or Tubes: Foley to gravity drainage, ON-Q catheter system  Implants: none  Specimens Removed: none  Complications: none  Intraoperative Findings:  Normal tubes ovaries and uterus.  Delivery resulted in the birth of a liveborn female, APGAR (1 MIN): 9   APGAR (5 MINS): 9, weight 7lbs 9oz  Patient Condition:stable  Procedure in Detail:  Patient was taken to the operating room were she was administered regional anesthesia.  She was positioned in the supine position, prepped and draped in the  Usual sterile fashion.  Prior to proceeding with the case a time out was performed and the level of anesthetic was checked and noted to be adequate.  Utilizing the scalpel a pfannenstiel skin incision was made 2cm above the pubic symphysis utilizing the patient's pre-existing scar and carried down sharply to the the level of the rectus fascia.  The fascia was incised in the midline using the scalpel and then extended using mayo scissors.  The superior border of the rectus fascia was grasped with two Kocher clamps and the underlying rectus muscles were dissected of the fascia using blunt dissection.  The median raphae was incised  using Mayo scissors.   The inferior border of the rectus fascia was dissected of the rectus muscles in a similar fashion.  The midline was identified, the peritoneum was entered bluntly and expanded using manual tractions.  The uterus was noted to be in a none rotated position.  Next the bladder blade was placed retracting the bladder caudally.  A bladder flap was not created.  A low transverse incision was scored on the lower uterine segment.  The hysterotomy was entered bluntly using the operators finger.  The hysterotomy incision was extended using manual traction.  The operators hand was placed within the hysterotomy position noting the fetus to be within the OA position.  The vertex was grasped, flexed, brought to the incision, and delivered a traumatically using fundal pressure applied by Dr. Glennon Mac.  The remainder of the body delivered with ease.  The infant was suctioned, cord was clamped and cut before handing off to the awaiting neonatologist.  The placenta was delivered using manual extraction.  The uterus was exteriorized, wiped clean of clots and debris using two moist laps.  The hysterotomy was closed using a two layer closure of 0 Vicryl, with the first being a running locked, the second a vertical imbricating.  The uterus was returned to the abdomen.  The peritoneal gutters were wiped clean of clots and debris using two moist laps.  The hysterotomy incision was re-inspected noted to be hemostatic.  The rectus muscles were inspected noted to be hemostatic.  The superior border of the rectus fascia was grasped with a Kocher clamp.  The ON-Q trocars were then placed 4cm above the superior border of the incision  and tunneled subfascially.  The introducers were removed and the catheters were threaded through the sleeves after which the sleeves were removed.  The fascia was closed using a looped #1 PDS in a running fashion taking 1cm by 1cm bites.  The subcutaneous tissue was irrigated using warm  saline, hemostasis achieved using the bovie.  The subcutaneous dead space was less than 3cm and was not closed.  The skin was closed using 4-0 Monocryl in a subcuticular fashion.  Sponge needle and instrument counts were corrects times two.  The patient tolerated the procedure well and was taken to the recovery room in stable condition.

## 2020-11-28 NOTE — Progress Notes (Signed)
Diana Stuart is a 30 y.o. G4P1021 at [redacted]w[redacted]d by ultrasound admitted for regular contractions at term, with history of previous Cesarean Section. She continues to contract regularly. Her baby moves well, and she rested overnight after receiving two doses of IV Stadol.   Subjective:she reprots her contractions have picked back up this morning. She is uncomfortable.   Objective: BP 104/71 (BP Location: Right Arm)   Pulse 79   Temp 98.5 F (36.9 C) (Oral)   Resp 15   Ht 5' (1.524 m)   Wt 69.4 kg   LMP 03/05/2020 (Exact Date)   SpO2 100%   BMI 29.88 kg/m  I/O last 3 completed shifts: In: 1015.7 [I.V.:1015.7] Out: -  No intake/output data recorded.  FHT:  FHR: 135 bpm, variability: moderate,  accelerations:  Present,  decelerations:  Absent UC:   regular, every 2-4 minutes, moderate strength SVE:   Dilation: Closed Exam by:: Rolly Salter CNM  Labs: Lab Results  Component Value Date   WBC 10.9 (H) 11/27/2020   HGB 9.7 (L) 11/27/2020   HCT 31.4 (L) 11/27/2020   MCV 81.3 11/27/2020   PLT 346 11/27/2020    Assessment / Plan: Regular contractions with previous plan for repeat CS at 39 weeks   Fetal Wellbeing:   alternates between Category 1 and Occasionally , Cat 2. Some periods of minimal variability Pain Control:  IV pain meds I/D:  n/a Anticipated MOD:   Discussed  her desire for proceeding wth a repeat CS today with Dr. Georgianne Fick. Will recheck her this morning, and Dr. Georgianne Fick to proceed with a POC. Placed NPO.  [name] 11/28/2020, 8:26 AM

## 2020-11-28 NOTE — Transfer of Care (Signed)
Immediate Anesthesia Transfer of Care Note  Patient: Diana Stuart  Procedure(s) Performed: CESAREAN SECTION  Patient Location: LDR 6  Anesthesia Type:Spinal  Level of Consciousness: awake, alert  and oriented  Airway & Oxygen Therapy: Patient Spontanous Breathing  Post-op Assessment: Report given to RN and Post -op Vital signs reviewed and stable  Post vital signs: stable  Last Vitals:  Vitals Value Taken Time  BP 106/70   Temp    Pulse 62   Resp    SpO2 100     Last Pain:  Vitals:   11/28/20 1152  TempSrc: Oral  PainSc:          Complications: No notable events documented.

## 2020-11-29 ENCOUNTER — Encounter: Payer: Self-pay | Admitting: Obstetrics and Gynecology

## 2020-11-29 LAB — CBC
HCT: 26.8 % — ABNORMAL LOW (ref 36.0–46.0)
Hemoglobin: 8.9 g/dL — ABNORMAL LOW (ref 12.0–15.0)
MCH: 27.3 pg (ref 26.0–34.0)
MCHC: 33.2 g/dL (ref 30.0–36.0)
MCV: 82.2 fL (ref 80.0–100.0)
Platelets: 307 10*3/uL (ref 150–400)
RBC: 3.26 MIL/uL — ABNORMAL LOW (ref 3.87–5.11)
RDW: 13.8 % (ref 11.5–15.5)
WBC: 16.5 10*3/uL — ABNORMAL HIGH (ref 4.0–10.5)
nRBC: 0 % (ref 0.0–0.2)

## 2020-11-29 MED ORDER — LIDOCAINE HCL (PF) 2 % IJ SOLN
INTRAMUSCULAR | Status: AC
  Filled 2020-11-29: qty 5

## 2020-11-29 MED ORDER — PROPOFOL 500 MG/50ML IV EMUL
INTRAVENOUS | Status: AC
  Filled 2020-11-29: qty 50

## 2020-11-29 MED ORDER — IBUPROFEN 600 MG PO TABS
ORAL_TABLET | ORAL | Status: AC
  Administered 2020-11-29: 600 mg
  Filled 2020-11-29: qty 1

## 2020-11-29 MED ORDER — PHENYLEPHRINE HCL (PRESSORS) 10 MG/ML IV SOLN
INTRAVENOUS | Status: AC
  Filled 2020-11-29: qty 1

## 2020-11-29 MED ORDER — PROPOFOL 500 MG/50ML IV EMUL
INTRAVENOUS | Status: AC
  Filled 2020-11-29: qty 100

## 2020-11-29 MED ORDER — DEXMEDETOMIDINE HCL IN NACL 200 MCG/50ML IV SOLN
INTRAVENOUS | Status: AC
  Filled 2020-11-29: qty 50

## 2020-11-29 MED ORDER — IBUPROFEN 600 MG PO TABS
600.0000 mg | ORAL_TABLET | Freq: Four times a day (QID) | ORAL | Status: DC
  Administered 2020-11-29 – 2020-11-30 (×4): 600 mg via ORAL
  Filled 2020-11-29 (×4): qty 1

## 2020-11-29 NOTE — Anesthesia Post-op Follow-up Note (Signed)
  Anesthesia Pain Follow-up Note  Patient: Diana Stuart  Day #: 1  Date of Follow-up: 11/29/2020 Time: 10:26 AM  Last Vitals:  Vitals:   11/29/20 0409 11/29/20 0823  BP: 109/75 122/80  Pulse: 60 62  Resp: 18 18  Temp: 37.2 C 37 C  SpO2: 100% 100%    Level of Consciousness: alert  Pain: none   Side Effects:None  Catheter Site Exam:clean, dry, no drainage     Plan: D/C from anesthesia care at surgeon's request  Garrison Michie

## 2020-11-29 NOTE — Progress Notes (Signed)
After ambulating for the first time patient's IV began to leak. This RN attempted to redress, stabilize, and flush the IV but it continued to leak. IV was removed. Patient received approximately 12 hours of Pitocin infusion. Patient preferred not to get another IV and is okay with transitioning to PO pain med instead of continuing Toradol. Patient has ambulated in the room independently and foley was removed. Great UOP and scant bleeding. I advised that we will keep an eye on her bleeding, output, and pain, and if needed we will replace the IV. Will continue to monitor patient closely.  Erlene Quan 11/29/2020 1:03 AM

## 2020-11-29 NOTE — Progress Notes (Signed)
Subjective: Postpartum Day 1: Cesarean Delivery Patient reports incisional pain, tolerating PO, + flatus, and no problems voiding.    Objective: Vital signs in last 24 hours: Temp:  [98.1 F (36.7 C)-99 F (37.2 C)] 98.6 F (37 C) (09/30 0823) Pulse Rate:  [46-77] 62 (09/30 0823) Resp:  [9-28] 18 (09/30 0823) BP: (91-149)/(60-91) 122/80 (09/30 0823) SpO2:  [94 %-100 %] 100 % (09/30 1040)  Physical Exam:  General: alert, cooperative, and no distress Lochia: appropriate Uterine Fundus: firm Incision: healing well, no significant drainage, some drainage with the On Q system DVT Evaluation: No evidence of DVT seen on physical exam. Negative Homan's sign.  Recent Labs    11/27/20 1832 11/29/20 0432  HGB 9.7* 8.9*  HCT 31.4* 26.8*    Assessment/Plan: Status post Cesarean section. Doing well postoperatively.  Continue current care. Ambulation encouraged for today. [name] 11/29/2020, 11:11 AM

## 2020-11-29 NOTE — Anesthesia Postprocedure Evaluation (Signed)
Anesthesia Post Note  Patient: Diana Stuart  Procedure(s) Performed: [place]  Patient location during evaluation: Mother Baby Anesthesia Type: Spinal Level of consciousness: oriented and awake and alert Pain management: pain level controlled Vital Signs Assessment: post-procedure vital signs reviewed and stable Respiratory status: spontaneous breathing and respiratory function stable Cardiovascular status: blood pressure returned to baseline and stable Postop Assessment: no headache, no backache, no apparent nausea or vomiting and able to ambulate Anesthetic complications: no   No notable events documented.   Last Vitals:  Vitals:   11/29/20 0409 11/29/20 0823  BP: 109/75 122/80  Pulse: 60 62  Resp: 18 18  Temp: 37.2 C 37 C  SpO2: 100% 100%    Last Pain:  Vitals:   11/29/20 0850  TempSrc:   PainSc: 3                  [name]

## 2020-11-30 MED ORDER — IBUPROFEN 600 MG PO TABS: 600.0000 mg | ORAL_TABLET | Freq: Four times a day (QID) | ORAL | 0 refills | Status: DC

## 2020-11-30 MED ORDER — ACETAMINOPHEN 500 MG PO TABS: 1000.0000 mg | ORAL_TABLET | Freq: Four times a day (QID) | ORAL | 0 refills | Status: AC | PRN

## 2020-11-30 MED ORDER — OXYCODONE HCL 5 MG PO TABS: 5.0000 mg | ORAL_TABLET | Freq: Four times a day (QID) | ORAL | 0 refills | Status: AC | PRN

## 2020-11-30 MED ORDER — DOCUSATE SODIUM 100 MG PO CAPS: 100.0000 mg | ORAL_CAPSULE | Freq: Two times a day (BID) | ORAL | 0 refills | Status: DC | PRN

## 2020-11-30 NOTE — Progress Notes (Signed)
Patient discharged with infant. Discharge instructions, prescriptions, and follow up appointments given to and reviewed with patient. Patient verbalized understanding. Will be escorted out by axillary.  °

## 2020-11-30 NOTE — Progress Notes (Signed)
Subjective:   She is feeling well. She reports she has has flatus. She denies BM. She is voiding. Pain is controlled. She would like to be discharged home today. Normal lochia.  Objective:  Blood pressure 122/86, pulse 87, temperature 98.2 F (36.8 C), temperature source Oral, resp. rate 18, height 5' (1.524 m), weight 69.4 kg, last menstrual period 03/05/2020, SpO2 99 %, unknown if currently breastfeeding.  General: NAD Pulmonary: no increased work of breathing Abdomen: non-distended, non-tender, fundus firm at level of umbilicus. Normal bowel sounds Incision: clea, dry, intact Extremities: no edema, no erythema, no tenderness  Results for orders placed or performed during the hospital encounter of 11/27/20 (from the past 72 hour(s))  Resp Panel by RT-PCR (Flu A&B, Covid) Nasopharyngeal Swab     Status: None   Collection Time: 11/27/20  6:32 PM   Specimen: Nasopharyngeal Swab; Nasopharyngeal(NP) swabs in vial transport medium  Result Value Ref Range   SARS Coronavirus 2 by RT PCR NEGATIVE NEGATIVE    Comment: (NOTE) SARS-CoV-2 target nucleic acids are NOT DETECTED.  The SARS-CoV-2 RNA is generally detectable in upper respiratory specimens during the acute phase of infection. The lowest concentration of SARS-CoV-2 viral copies this assay can detect is 138 copies/mL. A negative result does not preclude SARS-Cov-2 infection and should not be used as the sole basis for treatment or other patient management decisions. A negative result may occur with  improper specimen collection/handling, submission of specimen other than nasopharyngeal swab, presence of viral mutation(s) within the areas targeted by this assay, and inadequate number of viral copies(<138 copies/mL). A negative result must be combined with clinical observations, patient history, and epidemiological information. The expected result is Negative.  Fact Sheet for Patients:   EntrepreneurPulse.com.au  Fact Sheet for Healthcare Providers:  IncredibleEmployment.be  This test is no t yet approved or cleared by the [place] FDA and  has been authorized for detection and/or diagnosis of SARS-CoV-2 by FDA under an Emergency Use Authorization (EUA). This EUA will remain  in effect (meaning this test can be used) for the duration of the COVID-19 declaration under Section 564(b)(1) of the Act, 21 U.S.C.section 360bbb-3(b)(1), unless the authorization is terminated  or revoked sooner.       Influenza A by PCR NEGATIVE NEGATIVE   Influenza B by PCR NEGATIVE NEGATIVE    Comment: (NOTE) The Xpert Xpress SARS-CoV-2/FLU/RSV plus assay is intended as an aid in the diagnosis of influenza from Nasopharyngeal swab specimens and should not be used as a sole basis for treatment. Nasal washings and aspirates are unacceptable for Xpert Xpress SARS-CoV-2/FLU/RSV testing.  Fact Sheet for Patients: EntrepreneurPulse.com.au  Fact Sheet for Healthcare Providers: IncredibleEmployment.be  This test is not yet approved or cleared by the [place] FDA and has been authorized for detection and/or diagnosis of SARS-CoV-2 by FDA under an Emergency Use Authorization (EUA). This EUA will remain in effect (meaning this test can be used) for the duration of the COVID-19 declaration under Section 564(b)(1) of the Act, 21 U.S.C. section 360bbb-3(b)(1), unless the authorization is terminated or revoked.  Performed at [facility], [place], [address] [place], [place] [address]   CBC     Status: Abnormal   Collection Time: 11/27/20  6:32 PM  Result Value Ref Range   WBC 10.9 (H) 4.0 - 10.5 K/uL   RBC 3.86 (L) 3.87 - 5.11 MIL/uL   Hemoglobin 9.7 (L) 12.0 - 15.0 g/dL   HCT 31.4 (L) 36.0 - 46.0 %   MCV  81.3 80.0 - 100.0 fL   MCH 25.1 (L) 26.0 - 34.0 pg   MCHC 30.9 30.0 - 36.0 g/dL   RDW 14.0 11.5  - 15.5 %   Platelets 346 150 - 400 K/uL   nRBC 0.0 0.0 - 0.2 %    Comment: Performed at [facility], [address] [place], [place] [address]  RPR     Status: None   Collection Time: 11/27/20  6:32 PM  Result Value Ref Range   RPR Ser Ql NON REACTIVE NON REACTIVE    Comment: Performed at [facility], [place] [address], [place], [place] [address]  Rapid HIV screen (HIV 1/2 Ab+Ag)     Status: None   Collection Time: 11/27/20  6:32 PM  Result Value Ref Range   HIV-1 P24 Antigen - HIV24 NON REACTIVE NON REACTIVE    Comment: (NOTE) Detection of p24 may be inhibited by biotin in the sample, causing false negative results in acute infection.    HIV 1/2 Antibodies NON REACTIVE NON REACTIVE   Interpretation (HIV Ag Ab)      A non reactive test result means that HIV 1 or HIV 2 antibodies and HIV 1 p24 antigen were not detected in the specimen.    Comment: Performed at [facility], [address] [place], [place] [address]  Type and screen [place]     Status: None   Collection Time: 11/27/20  6:32 PM  Result Value Ref Range   ABO/RH(D) A POS    Antibody Screen NEG    Sample Expiration      11/30/2020,2359 Performed at [facility], [address] [place], [place] [address]   CBC     Status: Abnormal   Collection Time: 11/29/20  4:32 AM  Result Value Ref Range   WBC 16.5 (H) 4.0 - 10.5 K/uL   RBC 3.26 (L) 3.87 - 5.11 MIL/uL   Hemoglobin 8.9 (L) 12.0 - 15.0 g/dL   HCT 26.8 (L) 36.0 - 46.0 %   MCV 82.2 80.0 - 100.0 fL   MCH 27.3 26.0 - 34.0 pg   MCHC 33.2 30.0 - 36.0 g/dL   RDW 13.8 11.5 - 15.5 %   Platelets 307 150 - 400 K/uL   nRBC 0.0 0.0 - 0.2 %    Comment: Performed at [facility] [address], [place], [place] [address]     Assessment:   30 y.o. [id number] postoperativeday # 2   Plan:  1) Acute blood loss anemia - hemodynamically stable and asymptomatic - po ferrous sulfate  2) Blood Type  --/--/A POS (09/28 1832)   3) Rubella 6.76 (03/30 1549) / Varicella Immune  4) TDAP status - not given prenatally Immunization History  Administered Date(s) Administered   Moderna Sars-Covid-2 Vaccination 10/16/2019, 11/13/2019, 05/24/2020   Tdap 09/01/2018    5) Feeding- formula   6) Contraception - desires Nexplanon at follow up  7) Disposition - home today   Adrian Prows MD, [place] 11/30/2020 10:15 AM

## 2020-12-02 ENCOUNTER — Other Ambulatory Visit: Admission: RE | Admit: 2020-12-02 | Payer: Medicaid Other | Source: Ambulatory Visit

## 2020-12-03 ENCOUNTER — Inpatient Hospital Stay
Admission: RE | Admit: 2020-12-03 | Payer: Medicaid Other | Source: Home / Self Care | Admitting: Obstetrics and Gynecology

## 2020-12-03 ENCOUNTER — Encounter: Payer: Medicaid Other | Admitting: Obstetrics & Gynecology

## 2020-12-06 ENCOUNTER — Encounter: Payer: Self-pay | Admitting: Obstetrics and Gynecology

## 2020-12-06 ENCOUNTER — Other Ambulatory Visit: Payer: Self-pay

## 2020-12-06 ENCOUNTER — Ambulatory Visit (INDEPENDENT_AMBULATORY_CARE_PROVIDER_SITE_OTHER): Payer: Medicaid Other | Admitting: Obstetrics and Gynecology

## 2020-12-06 VITALS — BP 110/80 | Ht 60.0 in | Wt 137.0 lb

## 2020-12-06 DIAGNOSIS — Z4889 Encounter for other specified surgical aftercare: Secondary | ICD-10-CM

## 2020-12-10 ENCOUNTER — Ambulatory Visit: Payer: Medicaid Other | Admitting: Obstetrics and Gynecology

## 2020-12-17 NOTE — Progress Notes (Signed)
Postoperative Follow-up Patient presents post op from RLTCS 1weeks ago for  labor prior to scheduled cesarean section .  Subjective: Patient reports some improvement in her preop symptoms. Eating a regular diet without difficulty. Pain is controlled without any medications.  Activity: normal activities of daily living.  Objective: Blood pressure 110/80, height 5' (1.524 m), weight 137 lb (62.1 kg), not currently breastfeeding.  General: NAD Pulmonary: no increased work of breathing Abdomen: soft, non-tender, non-distended, incision D/C/I GU: normal external female genitalia Extremities: no edema Neurologic: normal gait   Admission on 11/27/2020, Discharged on 11/30/2020  Component Date Value Ref Range Status   SARS Coronavirus 2 by RT PCR 11/27/2020 NEGATIVE  NEGATIVE Final   Comment: (NOTE) SARS-CoV-2 target nucleic acids are NOT DETECTED.  The SARS-CoV-2 RNA is generally detectable in upper respiratory specimens during the acute phase of infection. The lowest concentration of SARS-CoV-2 viral copies this assay can detect is 138 copies/mL. A negative result does not preclude SARS-Cov-2 infection and should not be used as the sole basis for treatment or other patient management decisions. A negative result may occur with  improper specimen collection/handling, submission of specimen other than nasopharyngeal swab, presence of viral mutation(s) within the areas targeted by this assay, and inadequate number of viral copies(<138 copies/mL). A negative result must be combined with clinical observations, patient history, and epidemiological information. The expected result is Negative.  Fact Sheet for Patients:  EntrepreneurPulse.com.au  Fact Sheet for Healthcare Providers:  IncredibleEmployment.be  This test is no                          t yet approved or cleared by the [place] FDA and  has been authorized for detection and/or  diagnosis of SARS-CoV-2 by FDA under an Emergency Use Authorization (EUA). This EUA will remain  in effect (meaning this test can be used) for the duration of the COVID-19 declaration under Section 564(b)(1) of the Act, 21 U.S.C.section 360bbb-3(b)(1), unless the authorization is terminated  or revoked sooner.       Influenza A by PCR 11/27/2020 NEGATIVE  NEGATIVE Final   Influenza B by PCR 11/27/2020 NEGATIVE  NEGATIVE Final   Comment: (NOTE) The Xpert Xpress SARS-CoV-2/FLU/RSV plus assay is intended as an aid in the diagnosis of influenza from Nasopharyngeal swab specimens and should not be used as a sole basis for treatment. Nasal washings and aspirates are unacceptable for Xpert Xpress SARS-CoV-2/FLU/RSV testing.  Fact Sheet for Patients: EntrepreneurPulse.com.au  Fact Sheet for Healthcare Providers: IncredibleEmployment.be  This test is not yet approved or cleared by the [place] FDA and has been authorized for detection and/or diagnosis of SARS-CoV-2 by FDA under an Emergency Use Authorization (EUA). This EUA will remain in effect (meaning this test can be used) for the duration of the COVID-19 declaration under Section 564(b)(1) of the Act, 21 U.S.C. section 360bbb-3(b)(1), unless the authorization is terminated or revoked.  Performed at [facility], [address] [place], [place] [address]    WBC 11/27/2020 10.9 (A) 4.0 - 10.5 K/uL Final   RBC 11/27/2020 3.86 (A) 3.87 - 5.11 MIL/uL Final   Hemoglobin 11/27/2020 9.7 (A) 12.0 - 15.0 g/dL Final   HCT 11/27/2020 31.4 (A) 36.0 - 46.0 % Final   MCV 11/27/2020 81.3  80.0 - 100.0 fL Final   MCH 11/27/2020 25.1 (A) 26.0 - 34.0 pg Final   MCHC 11/27/2020 30.9  30.0 - 36.0 g/dL Final  RDW 11/27/2020 14.0  11.5 - 15.5 % Final   Platelets 11/27/2020 346  150 - 400 K/uL Final   nRBC 11/27/2020 0.0  0.0 - 0.2 % Final   Performed at [facility], [place], [place] [address]   RPR Ser Ql 11/27/2020 NON REACTIVE  NON REACTIVE Final   Performed at [facility], [place] [address] [place], [place] [address]   HIV-1 P24 Antigen - HIV24 11/27/2020 NON REACTIVE  NON REACTIVE Final   Comment: (NOTE) Detection of p24 may be inhibited by biotin in the sample, causing false negative results in acute infection.    HIV 1/2 Antibodies 11/27/2020 NON REACTIVE  NON REACTIVE Final   Interpretation (HIV Ag Ab) 11/27/2020 A non reactive test result means that HIV 1 or HIV 2 antibodies and HIV 1 p24 antigen were not detected in the specimen.   Final   Performed at [facility], [address] [place], [place] [address]   ABO/RH(D) 11/27/2020 A POS   Final   Antibody Screen 11/27/2020 NEG   Final   Sample Expiration 11/27/2020    Final                   Value:11/30/2020,2359 Performed at [facility], [address] [place], [place] [address]    Gonorrhea 11/14/2020 Negative   Final   Varicella 05/29/2020 Immune   Final   WBC 11/29/2020 16.5 (A) 4.0 - 10.5 K/uL Final   RBC 11/29/2020 3.26 (A) 3.87 - 5.11 MIL/uL Final   Hemoglobin 11/29/2020 8.9 (A) 12.0 - 15.0 g/dL Final   HCT 11/29/2020 26.8 (A) 36.0 - 46.0 % Final   MCV 11/29/2020 82.2  80.0 - 100.0 fL Final   MCH 11/29/2020 27.3  26.0 - 34.0 pg Final   MCHC 11/29/2020 33.2  30.0 - 36.0 g/dL Final   RDW 11/29/2020 13.8  11.5 - 15.5 % Final   Platelets 11/29/2020 307  150 - 400 K/uL Final   nRBC 11/29/2020 0.0  0.0 - 0.2 % Final   Performed at [facility], [address] [place], [place] [address]    Assessment: 30 y.o. s/p RLTCS stable  Plan: Patient has done well after surgery with no apparent complications.  I have discussed the post-operative course to date, and the expected progress moving forward.  The patient understands what complications to be concerned about.  I will see the patient in routine follow up, or sooner if needed.    Activity plan: No  heavy lifting.  Desires Nexplanon insertion for postpartum contraception   Malachy Mood, MD, [facility], [facility] 12/17/2020, 7:21 PM

## 2021-01-22 ENCOUNTER — Other Ambulatory Visit: Payer: Self-pay

## 2021-01-22 ENCOUNTER — Ambulatory Visit (INDEPENDENT_AMBULATORY_CARE_PROVIDER_SITE_OTHER): Payer: Medicaid Other | Admitting: Obstetrics and Gynecology

## 2021-01-22 ENCOUNTER — Encounter: Payer: Self-pay | Admitting: Obstetrics and Gynecology

## 2021-01-22 DIAGNOSIS — Z30017 Encounter for initial prescription of implantable subdermal contraceptive: Secondary | ICD-10-CM | POA: Diagnosis not present

## 2021-01-22 NOTE — Progress Notes (Signed)
Postpartum Visit  Chief Complaint:  Chief Complaint  Patient presents with   Post-op Follow-up    6 wk postpartum - no concerns. RM 4    History of Present Illness: Patient is a 30 y.o. [id number] presents for postpartum visit.  Date of delivery: 11/29/2018 Cesarean Section: Elective repeat Pregnancy or labor problems:  no Any problems since the delivery:  no  Newborn Details:  SINGLETON :  1. BabyGender female. Birth weight: 7lbs 9oz Maternal Details:  Breast or formula feeding: plans to breastfeed Contraception after delivery: No  Any bowel or bladder issues: No  Post partum depression/anxiety noted:  no Edinburgh Post-Partum Depression Score:2 Date of last PAP: 04/29/2018 NILM    Review of Systems: Review of Systems  Constitutional: Negative.   Gastrointestinal: Negative.   Genitourinary: Negative.   Psychiatric/Behavioral: Negative.     The following portions of the patient's history were reviewed and updated as appropriate: allergies, current medications, past family history, past medical history, past social history, past surgical history, and problem list.  Past Medical History:  Past Medical History:  Diagnosis Date   Miscarriage    Seizure [facility])     Past Surgical History:  Past Surgical History:  Procedure Laterality Date   CESAREAN SECTION N/A 11/10/2018   Procedure: PRIMARY CESAREAN SECTION;  Surgeon: Will Bonnet, MD;  Location: [facility] ORS;  Service: Obstetrics;  Laterality: N/A;  Baby Boy Born @ 1703 Apgars: 8/9 Weight: 8  lbs 14 ozs   CESAREAN SECTION N/A 11/28/2020   Procedure: CESAREAN SECTION;  Surgeon: Malachy Mood, MD;  Location: [facility] ORS;  Service: Obstetrics;  Laterality: N/A;    Family History:  Family History  Problem Relation Age of Onset   Lung cancer Maternal Grandfather 70   Breast cancer Paternal Grandmother 2    Social History:  Social History   Socioeconomic History   Marital status: Married    Spouse name: Not  on file   Number of children: Not on file   Years of education: Not on file   Highest education level: Not on file  Occupational History   Not on file  Tobacco Use   Smoking status: Former    Types: Cigarettes   Smokeless tobacco: Never  Vaping Use   Vaping Use: Never used  Substance and Sexual Activity   Alcohol use: Not Currently   Drug use: No   Sexual activity: Not Currently    Birth control/protection: Implant  Other Topics Concern   Not on file  Social History Narrative   Not on file   Social Determinants of Health   Financial Resource Strain: Not on file  Food Insecurity: Not on file  Transportation Needs: Not on file  Physical Activity: Not on file  Stress: Not on file  Social Connections: Not on file  Intimate Partner Violence: Not on file    Allergies:  Allergies  Allergen Reactions   Grape Flavor [Grape (Artificial) Flavor] Hives    Medications: Prior to Admission medications   Medication Sig Start Date End Date Taking? Authorizing Provider  acetaminophen (TYLENOL) 500 MG tablet Take 2 tablets (1,000 mg total) by mouth every 6 (six) hours as needed. 11/30/20 11/30/21  Schuman, Stefanie Libel, MD  ferrous sulfate 325 (65 FE) MG tablet Take 325 mg by mouth every other day.    [provider]  oxyCODONE (ROXICODONE) 5 MG immediate release tablet Take 1 tablet (5 mg total) by mouth every 6 (six) hours as needed. 11/30/20 11/30/21  Homero Fellers, MD    Physical Exam Height 5' (1.524 m), weight 136 lb (61.7 kg), last menstrual period 01/17/2021, not currently breastfeeding.  General: NAD HEENT: normocephalic, anicteric Pulmonary: No increased work of breathing Abdomen: NABS, soft, non-tender, non-distended.  Umbilicus without lesions.  No hepatomegaly, splenomegaly or masses palpable. No evidence of hernia. Incision D/C/I Genitourinary:  External: Normal external female genitalia.  Normal urethral meatus, normal  Bartholin's and Skene's glands.     Vagina: Normal vaginal mucosa, no evidence of prolapse.    Cervix: Grossly normal in appearance, no bleeding  Uterus: Non-enlarged, mobile, normal contour.  No CMT  Adnexa: ovaries non-enlarged, no adnexal masses  Rectal: deferred Extremities: no edema, erythema, or tenderness Neurologic: Grossly intact Psychiatric: mood appropriate, affect full     GYNECOLOGY PROCEDURE NOTE  Patient is a 30 y.o. [id number] presenting for Nexplanon insertion as her desires means of contraception.  She provided informed consent, signed copy in the chart, time out was performed.   She understands that Nexplanon is a progesterone only therapy, and that patients often patients have irregular and unpredictable vaginal bleeding or amenorrhea. She understands that other side effects are possible related to systemic progesterone, including but not limited to, headaches, breast tenderness, nausea, and irritability. While effective at preventing pregnancy long acting reversible contraceptives do not prevent transmission of sexually transmitted diseases and use of barrier methods for this purpose was discussed. The placement procedure for Nexplanon was reviewed with the patient in detail including risks of nerve injury, infection, bleeding and injury to other muscles or tendons. She understands that the Nexplanon implant is good for 3 years and needs to be removed at the end of that time.  She understands that Nexplanon is an extremely effective option for contraception, with failure rate of <1%. This information is reviewed today and all questions were answered. Informed consent was obtained, both verbally and written.   The patient is healthy and has no contraindications to Implanon use. Urine pregnancy test was performed today and was negative.  Procedure Appropriate time out taken.  Patient placed in dorsal supine with left arm above head, elbow flexed at 90 degrees, arm resting on examination table.  The bicipital  grove was palpated and site 8-10cm proximal to the medial epicondyle was indentified . The insertion site was prepped with a two betadine swabs and then injected with 3 cc of 1% lidocaine without epinephrine.  Nexplanon removed form sterile blister packaging,  Device confirmed in needle, before inserting full length of needle, tenting up the skin as the needle was advance.  The drug eluting rod was then deployed by pulling back the slider per the manufactures recommendation.  The implant was palpable by the clinician as well as the patient.  The insertion site covered dressed with a band aid before applying  a kerlex bandage pressure dressing..Minimal blood loss was noted during the procedure.  The patientt tolerated the procedure well.   She was instructed to wear the bandage for 24 hours, call with any signs of infection.  She was given the Implanon card and instructed to have the rod removed in 3 years.  Assessment: 30 y.o. [id number] presenting for 6 week postpartum visit  Plan: Problem List Items Addressed This Visit   None Visit Diagnoses     6 weeks postpartum follow-up    -  Primary   Nexplanon insertion           1) Contraception - Education given regarding options for contraception, as  well as compatibility with breast feeding if applicable.  Patient plans on Nexplanon for contraception.  2)  Pap - ASCCP guidelines and rational discussed.  ASCCP guidelines and rational discussed.  Patient opts for every 3 years screening interval  3) Patient underwent screening for postpartum depression with no signs of depression  4) Return in about 1 year (around 01/22/2022) for annual.   Malachy Mood, MD, [place], [facility] 01/22/2021, 1:40 PM

## 2021-05-06 ENCOUNTER — Encounter: Payer: Self-pay | Admitting: Obstetrics and Gynecology

## 2021-05-06 ENCOUNTER — Other Ambulatory Visit: Payer: Self-pay | Admitting: Obstetrics and Gynecology

## 2021-05-06 DIAGNOSIS — N921 Excessive and frequent menstruation with irregular cycle: Secondary | ICD-10-CM

## 2021-05-06 MED ORDER — TRANEXAMIC ACID 650 MG PO TABS: 1300.0000 mg | ORAL_TABLET | Freq: Three times a day (TID) | ORAL | 0 refills | Status: DC

## 2021-05-06 NOTE — Progress Notes (Signed)
Lysteda Rx for irregular bleeding nexplanon ?

## 2021-11-24 IMAGING — US US OB COMP +14 WK
1 series · 15 of 28 positions shown · non-contrast
Comparison: none

CLINICAL DATA: Fetal anatomic survey

EXAM:
OBSTETRICAL ULTRASOUND >14 WKS

[Series 1: us ob comp + 14 wk · 15 of 76 slices shown]
[im 1/76]
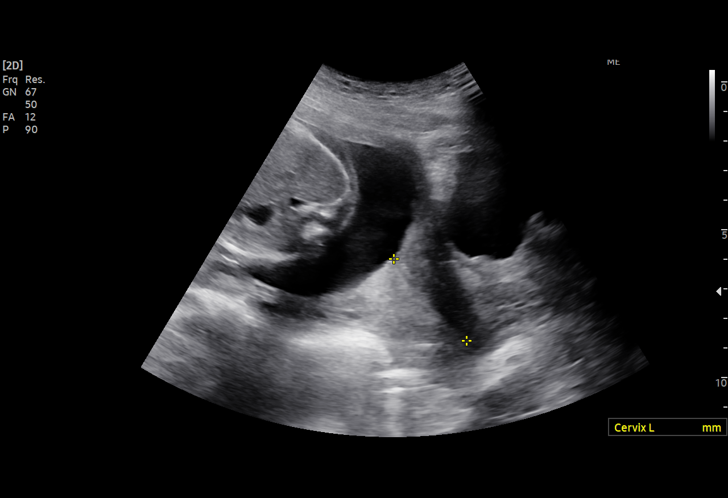
[im 6/76]
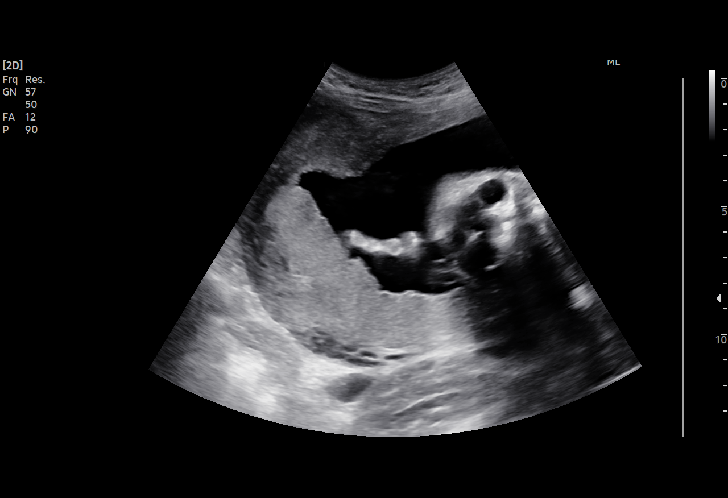
[im 12/76]
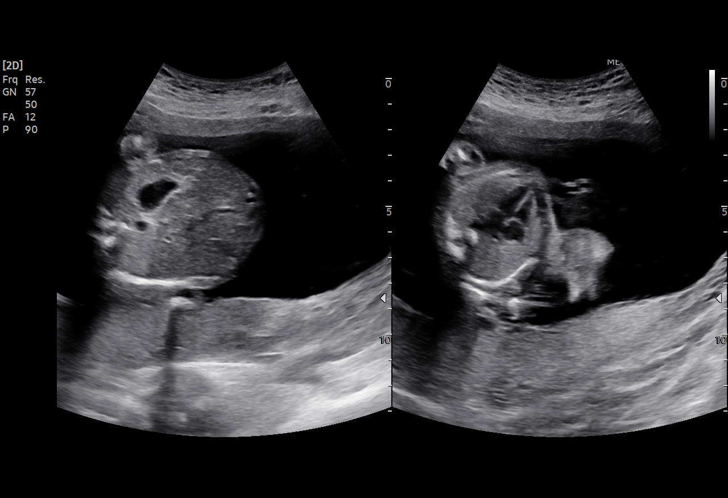
[im 17/76]
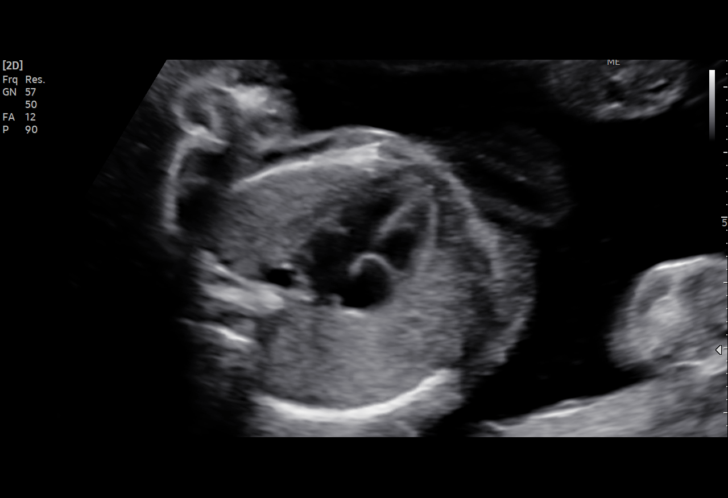
[im 23/76]
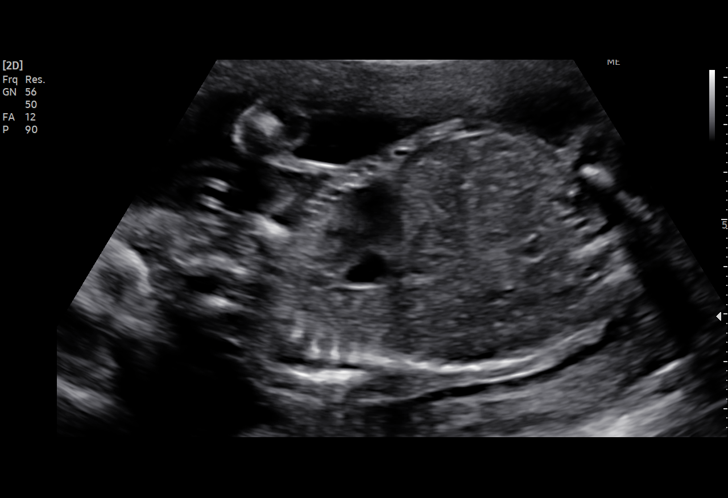
[im 28/76]
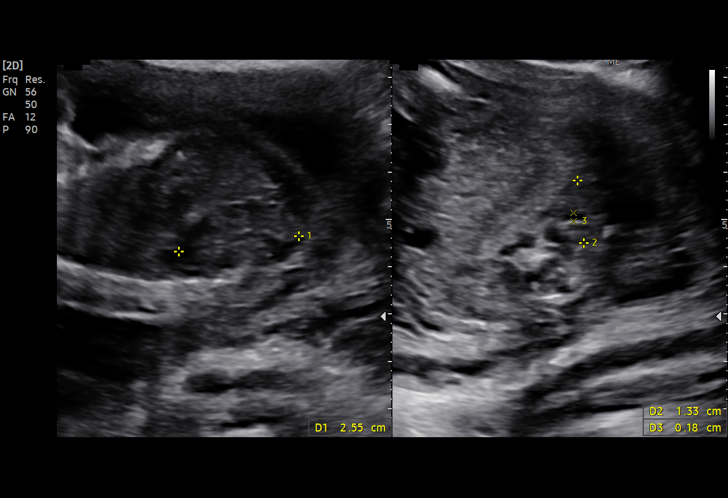
[im 34/76]
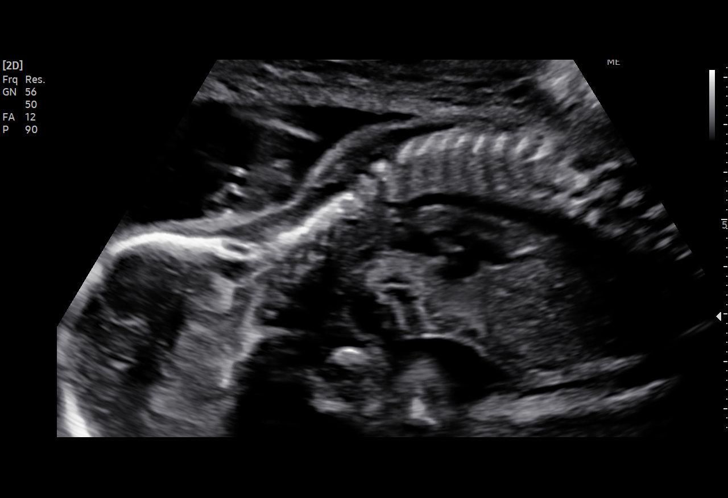
[im 39/76]
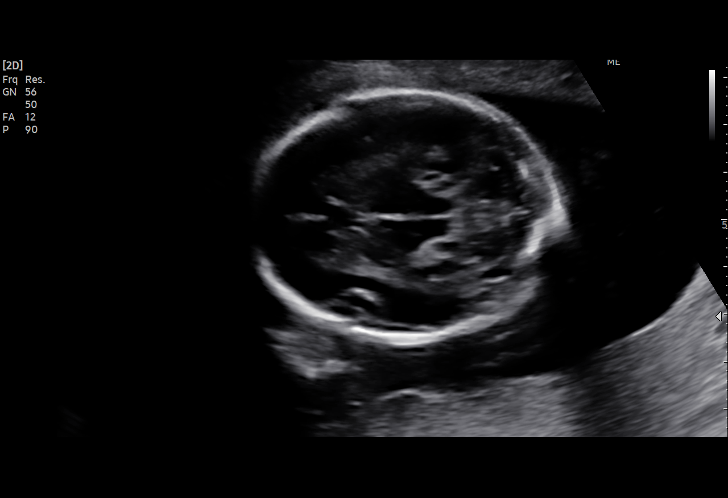
[im 42/76]
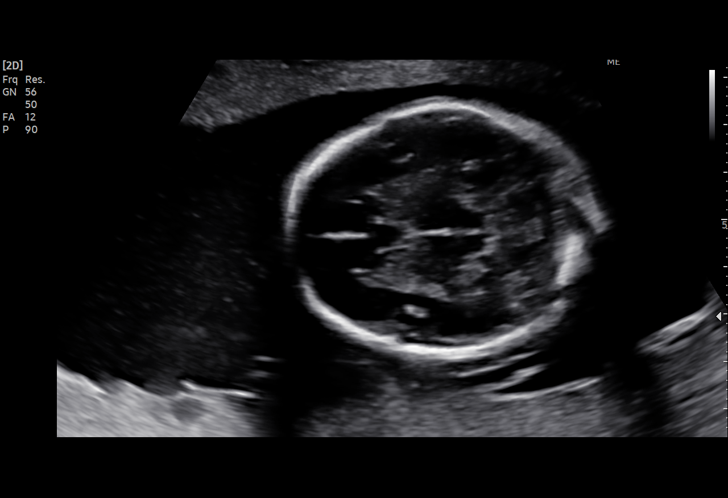
[im 48/76]
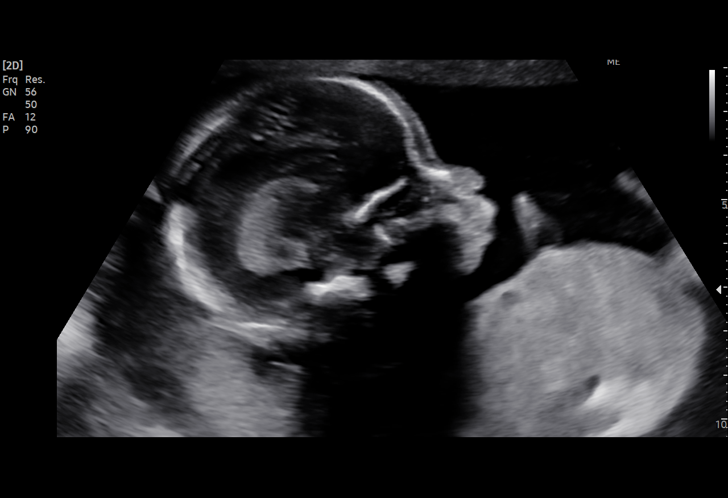
[im 53/76]
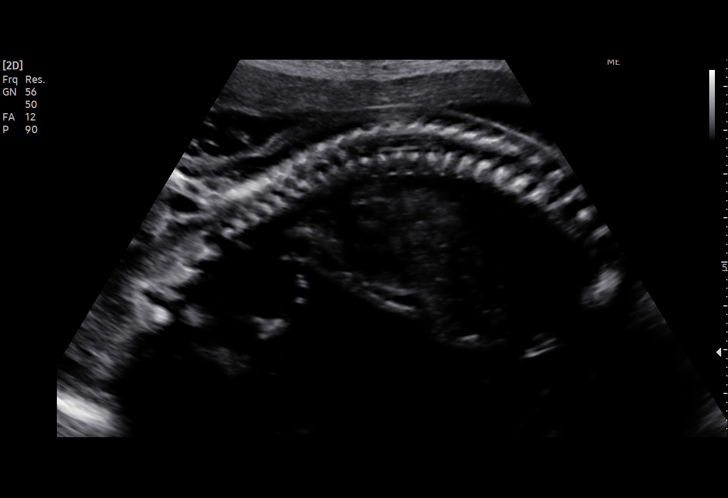
[im 59/76]
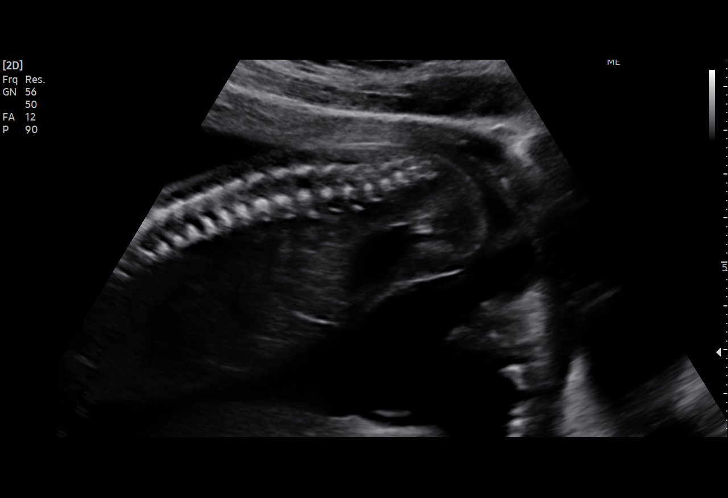
[im 64/76]
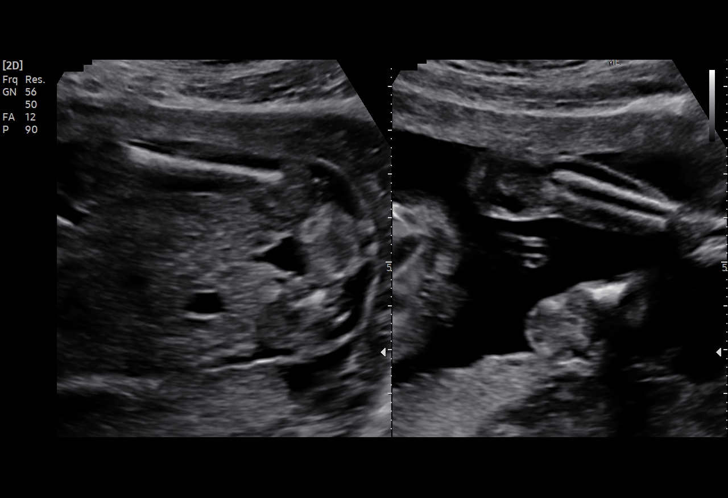
[im 70/76]
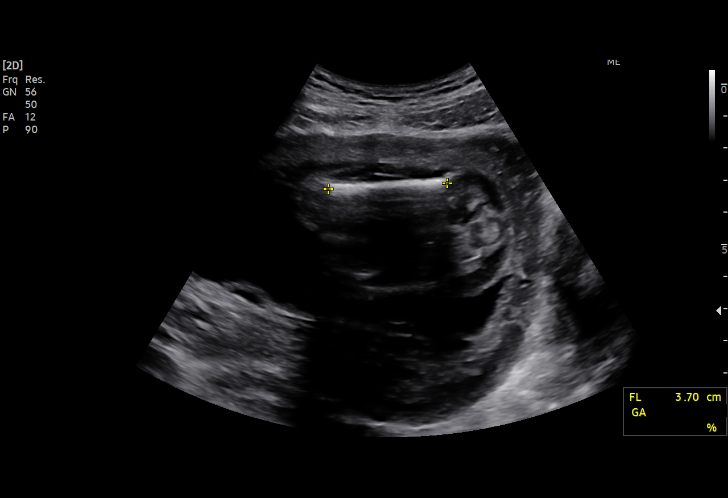
[im 76/76]
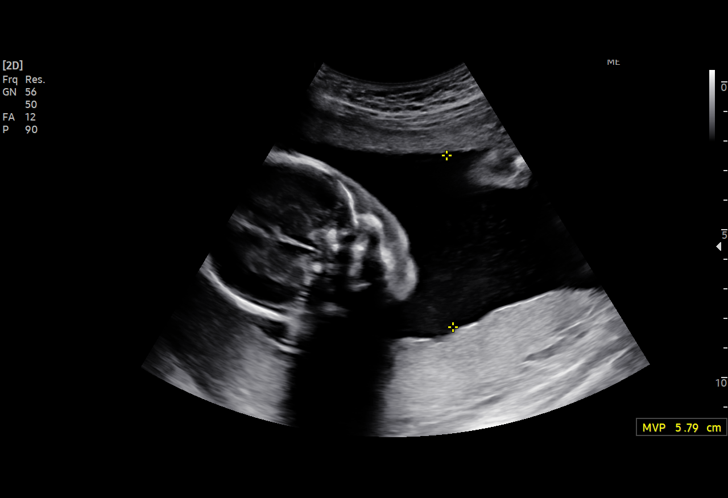

[15 of 28 positions shown; findings below may reference images not displayed]

FINDINGS: Number of Fetuses: 1

Heart Rate:  162 bpm

Movement: Yes

Presentation: Transverse

Previa: No

Placental Location: Posterior

Amniotic Fluid (Subjective): Normal

Amniotic Fluid (Objective):

Vertical pocket = 5.8cm

FETAL BIOMETRY

BPD: 5.2cm 21w 5d

HC:   19.2cm 21w 3d

AC:   17.1cm 22w 1d

FL:   3.7cm 21w 4d

Current Mean GA: 21w 4d US EDC: 12/06/2020

Assigned GA:  21w 0d Assigned EDC: 12/10/2020

FETAL ANATOMY

Lateral Ventricles: Appears normal

Thalami/CSP: Appears normal

Posterior Fossa:  Appears normal

Nuchal Region: Appears normal   NFT= 3.2 mm

Upper Lip: Appears normal

Spine: Appears normal

4 Chamber Heart on Left: Appears normal

LVOT: Appears normal

RVOT: Appears normal

Stomach on Left: Appears normal

3 Vessel Cord: Appears normal

Cord Insertion site: Appears normal

Kidneys: Appears normal

Bladder: Appears normal

Extremities: Appears normal

Technically difficult due to: N/a

Maternal Findings:

Cervix:  3.7 cm
IMPRESSION: 1. Single live intrauterine pregnancy as above, estimated age 21
weeks and 4 days.
2. Normal fetal anatomic survey.

## 2022-01-25 IMAGING — US US OB FOLLOW-UP
1 series · 15 of 28 positions shown · non-contrast
Comparison: none

CLINICAL DATA: History of IUGR

EXAM:
OBSTETRIC 14+ WK ULTRASOUND FOLLOW-UP

[Series 1: us ob follow up · 15 of 43 slices shown]
[im 1/43]
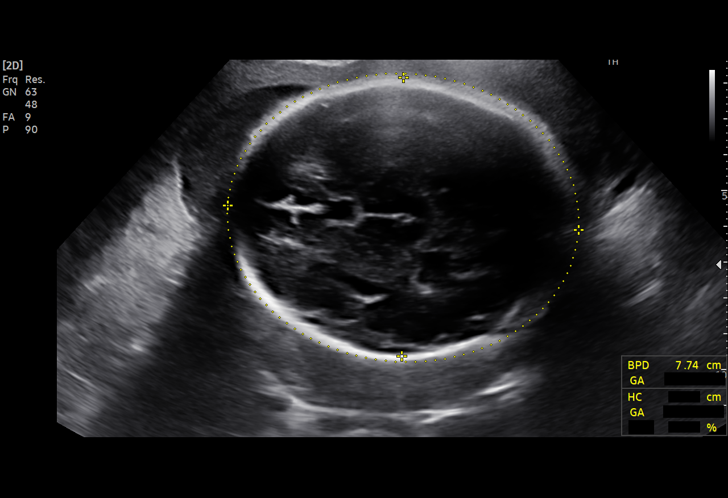
[im 4/43]
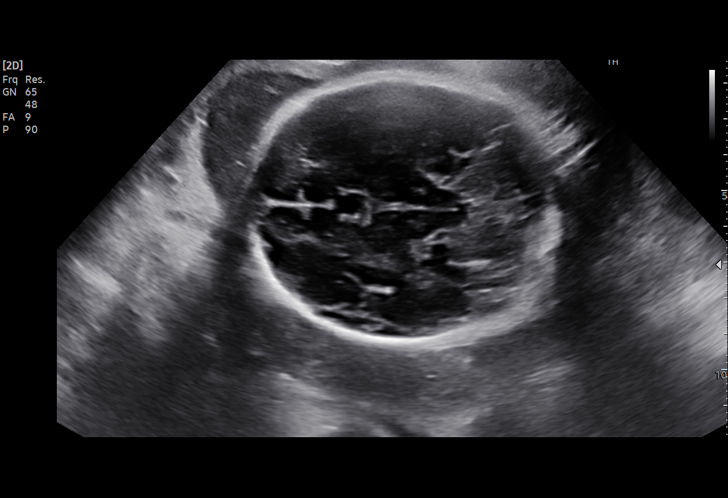
[im 7/43]
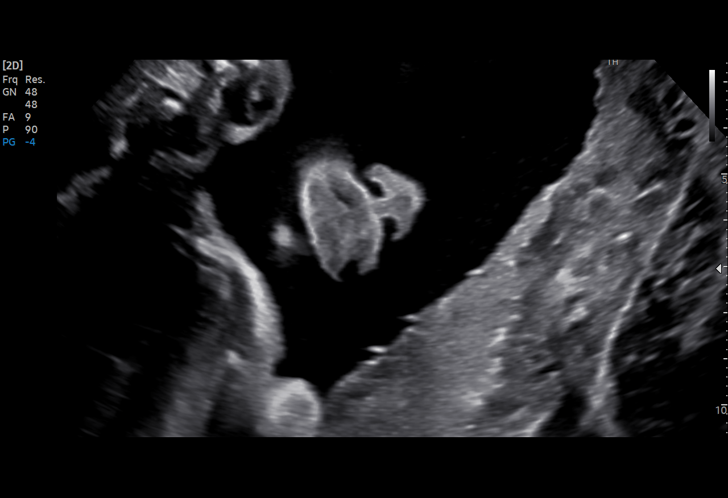
[im 10/43]
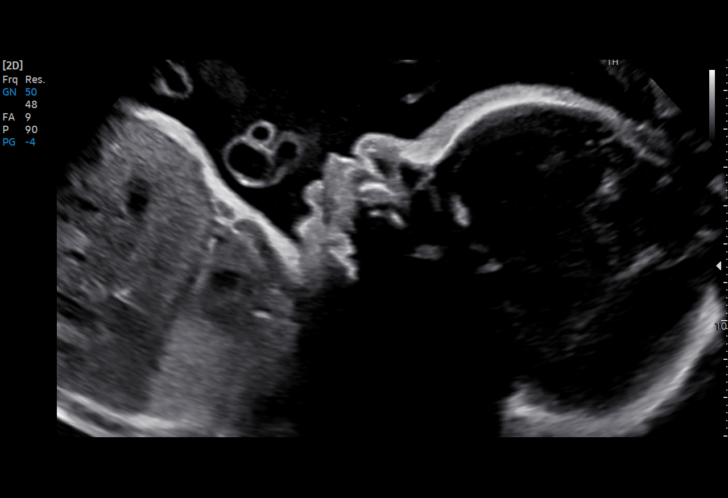
[im 13/43]
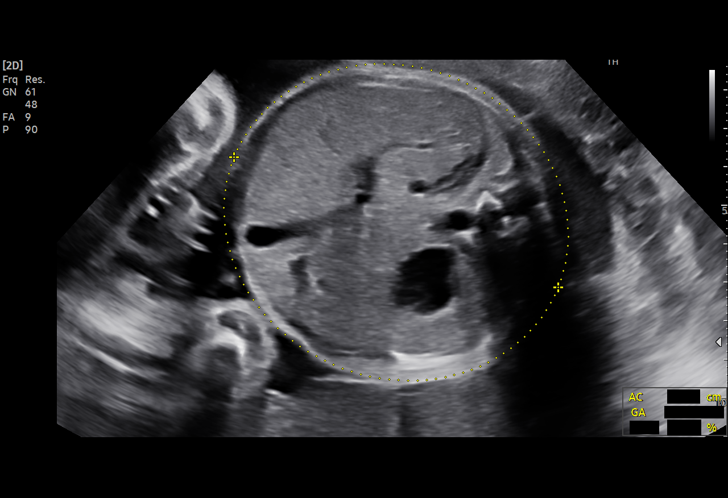
[im 16/43]
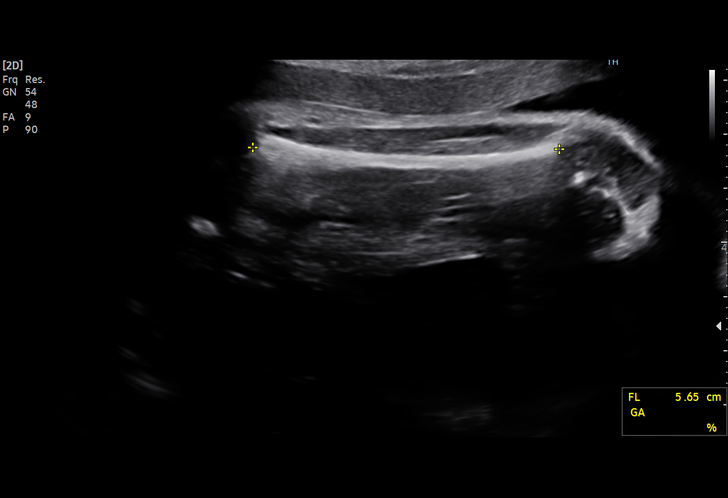
[im 19/43]
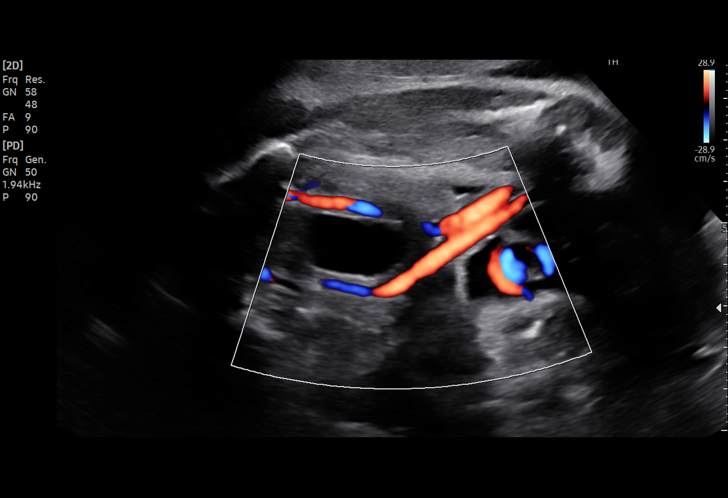
[im 22/43]
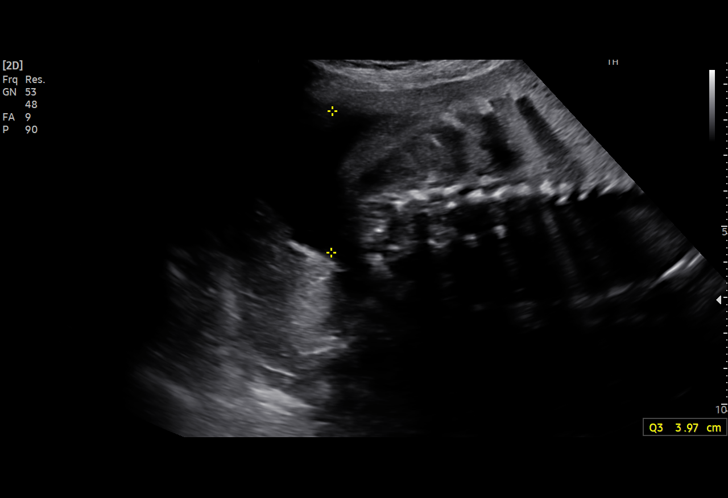
[im 24/43]
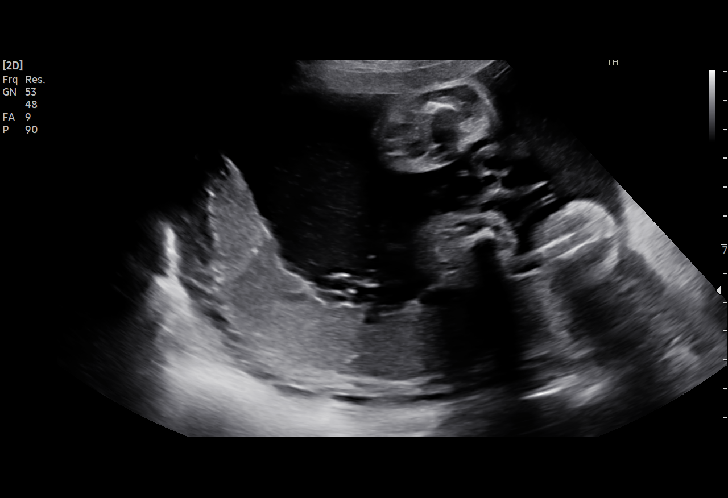
[im 27/43]
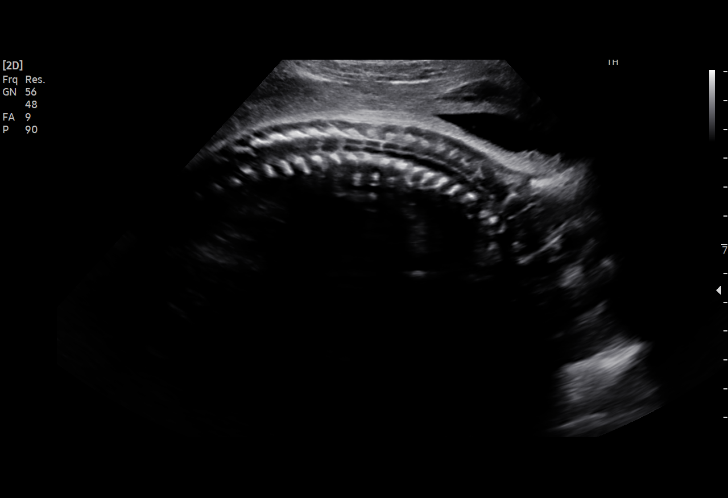
[im 30/43]
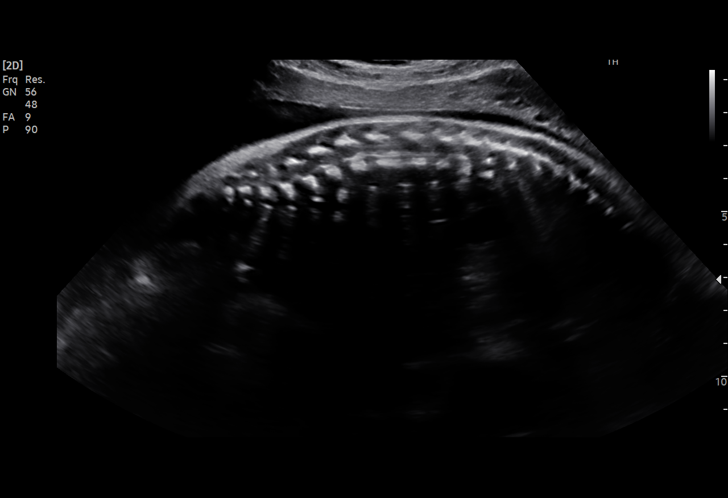
[im 33/43]
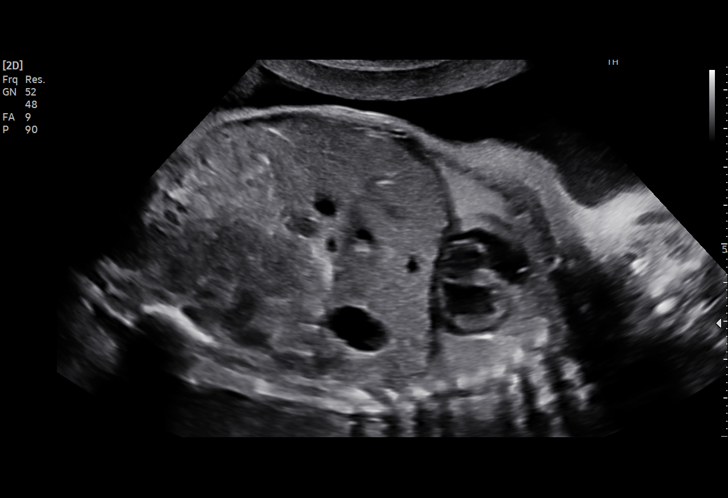
[im 36/43]
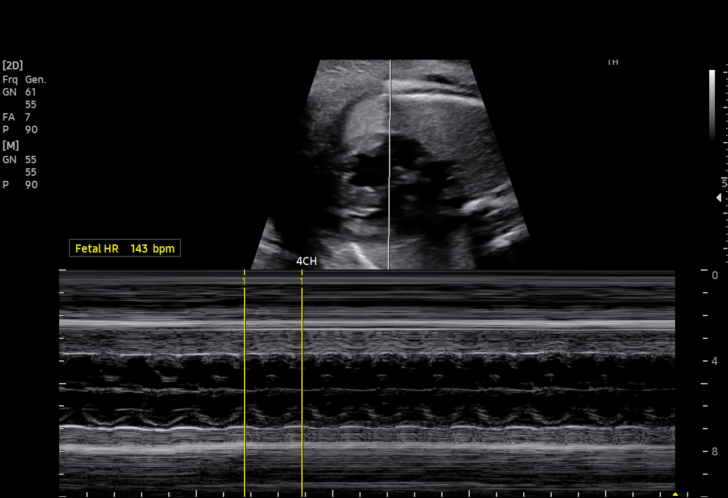
[im 39/43]
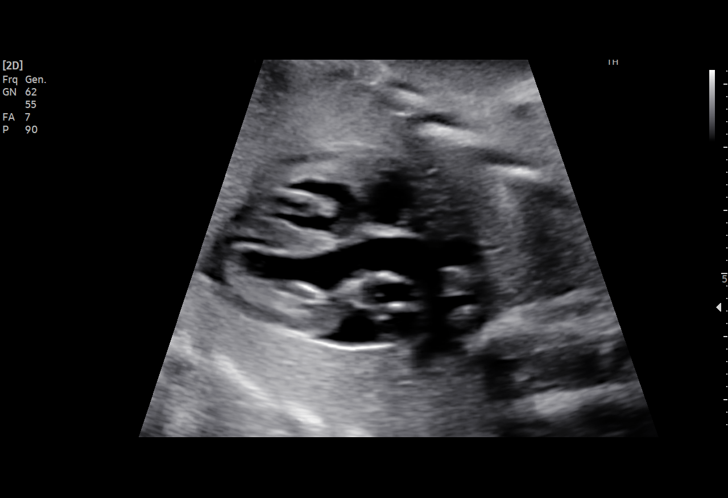
[im 43/43]
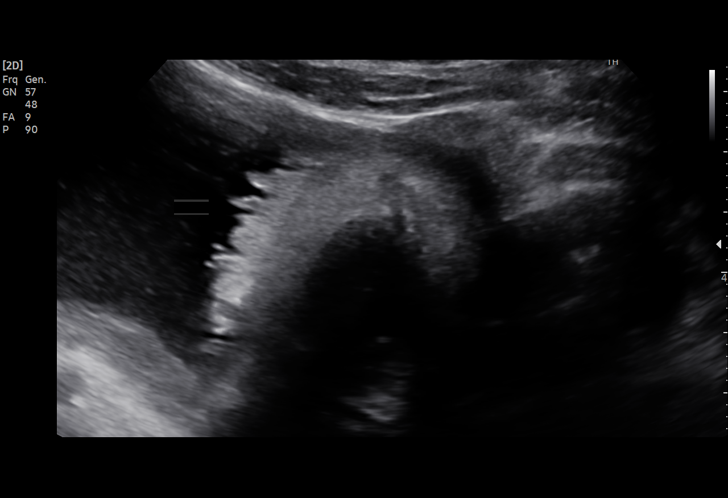

[15 of 28 positions shown; findings below may reference images not displayed]

FINDINGS: Number of Fetuses: 1

Heart Rate:  143 bpm

Movement: Yes

Presentation: Cephalic

Previa: No

Placental Location: Posterior

Amniotic Fluid (Subjective): Normal

Amniotic Fluid (Objective):

AFI 16.9 cm (5%ile= 9.0 cm, 95%= 23.4 cm for 30 wks)

FETAL BIOMETRY

BPD:  7.6cm 30w 3d

HC:    28.1cm 30w 6d

AC:    26.6cm 30w 5d

FL:    5.7cm 29w 5d

Current Mean GA: 30w 1d US EDC: 12/08/2020

Assigned GA: 30w 3d Assigned EDC: 12/06/2020

Estimated Fetal Weight:  1,563g 52%ile

FETAL ANATOMY

Previously performed

Technical Limitations: None.

Maternal Findings:

Cervix:  Closed.  3.1 cm.
IMPRESSION: 1. Single live intrauterine gestation in cephalic presentation.
2. Assigned gestational age of 30 weeks 1 day. Adequate interval
growth.
3. Estimated fetal weight is in the 52nd percentile.
4. Amniotic fluid index of 16.9 cm, within normal limits.

## 2023-02-14 ENCOUNTER — Other Ambulatory Visit: Payer: Self-pay

## 2023-02-14 ENCOUNTER — Emergency Department
Admission: EM | Admit: 2023-02-14 | Discharge: 2023-02-14 | Disposition: A | Discharge status: Home / Self Care | Payer: Medicaid Other | Attending: Student in an Organized Health Care Education/Training Program | Admitting: Student in an Organized Health Care Education/Training Program

## 2023-02-14 DIAGNOSIS — J039 Acute tonsillitis, unspecified: Secondary | ICD-10-CM | POA: Diagnosis not present

## 2023-02-14 DIAGNOSIS — Z1152 Encounter for screening for COVID-19: Secondary | ICD-10-CM | POA: Insufficient documentation

## 2023-02-14 DIAGNOSIS — J029 Acute pharyngitis, unspecified: Secondary | ICD-10-CM | POA: Diagnosis present

## 2023-02-14 LAB — RESP PANEL BY RT-PCR (RSV, FLU A&B, COVID)  RVPGX2
Influenza A by PCR: NEGATIVE
Influenza B by PCR: NEGATIVE
Resp Syncytial Virus by PCR: NEGATIVE
SARS Coronavirus 2 by RT PCR: NEGATIVE

## 2023-02-14 LAB — GROUP A STREP BY PCR: Group A Strep by PCR: NOT DETECTED

## 2023-02-14 MED ORDER — AMOXICILLIN 875 MG PO TABS: 875.0000 mg | ORAL_TABLET | Freq: Two times a day (BID) | ORAL | 0 refills | Status: DC

## 2023-02-14 NOTE — Discharge Instructions (Addendum)
Call make an appoint with Dr. Okey Dupre who is on-call for [facility] if any continued problems or not improving.  A prescription for amoxicillin 875 mg twice daily for 10 days was sent to the pharmacy.  Also take Tylenol or ibuprofen as needed for throat pain.  Increase fluids.

## 2023-02-14 NOTE — ED Triage Notes (Signed)
Pt comes with c/o sore throat for two days. Pt states pain when swallowing and hurts to talk. Pt denies any fever, cough or congestion.

## 2023-02-14 NOTE — ED Provider Notes (Signed)
[facility] Provider Note    Event Date/Time   First MD Initiated Contact with Patient 02/14/23 0805     (approximate)   History   Sore Throat   HPI  Diana Stuart is a 32 y.o. female   presents to the ED with complaint of sore throat for the last 2 days.  Patient states it hurts to swallow.  She denies any fever, cough, congestion, nausea or vomiting.      Physical Exam   Triage Vital Signs: ED Triage Vitals  Encounter Vitals Group     BP 02/14/23 0755 126/80     Systolic BP Percentile --      Diastolic BP Percentile --      Pulse Rate 02/14/23 0755 100     Resp 02/14/23 0755 18     Temp 02/14/23 0755 98 F (36.7 C)     Temp src --      SpO2 02/14/23 0755 97 %     Weight 02/14/23 0754 120 lb (54.4 kg)     Height 02/14/23 0754 5' (1.524 m)     Head Circumference --      Peak Flow --      Pain Score 02/14/23 0753 8     Pain Loc --      Pain Education --      Exclude from Growth Chart --     Most recent vital signs: Vitals:   02/14/23 0755  BP: 126/80  Pulse: 100  Resp: 18  Temp: 98 F (36.7 C)  SpO2: 97%     General: Awake, no distress.  CV:  Good peripheral perfusion.  Heart regular rate and rhythm. Resp:  Normal effort.  Lungs are clear bilaterally. Abd:  No distention.  Other:  EACs and TMs are clear.  Posterior pharynx with out erythema.  Tonsils are exudative.  Uvula is midline.  Neck is supple with minimal cervical lymphadenopathy.  Patient is able to maintain saliva without any difficulty.   ED Results / Procedures / Treatments   Labs (all labs ordered are listed, but only abnormal results are displayed) Labs Reviewed  GROUP A STREP BY PCR  RESP PANEL BY RT-PCR (RSV, FLU A&B, COVID)  [id number]      PROCEDURES:  Critical Care performed:   Procedures   MEDICATIONS ORDERED IN ED: Medications - No data to display   IMPRESSION / MDM / ASSESSMENT AND PLAN / ED COURSE  I reviewed the triage vital signs  and the nursing notes.   Differential diagnosis includes, but is not limited to, pharyngitis, COVID, influenza, RSV, tonsillitis, peritonsillar abscess, viral pharyngitis.  32 year old female presents to the ED with complaint of sore throat for the last 2 days.  Patient denies any fever or chills.  Physical exam shows exudative tonsils bilaterally.  Patient is able to talk and swallow without any difficulty.  Respiratory panel and strep were negative.  A prescription for amoxicillin 875 twice daily for 10 days was sent to the pharmacy for her to begin taking.  She is also to follow-up with Dr. Okey Dupre who is on-call for [facility] if there is no improvement.  She is also instructed to take Tylenol or ibuprofen as needed for throat pain.      Patient's presentation is most consistent with acute complicated illness / injury requiring diagnostic workup.  FINAL CLINICAL IMPRESSION(S) / ED DIAGNOSES   Final diagnoses:  Exudative tonsillitis     Rx / DC Orders  ED Discharge Orders          Ordered    amoxicillin (AMOXIL) 875 MG tablet  2 times daily        02/14/23 0920             Note:  This document was prepared using Dragon voice recognition software and may include unintentional dictation errors.   Tommi Rumps, PA-C 02/14/23 1030    Willy Eddy, MD 02/14/23 1047

## 2024-01-24 ENCOUNTER — Encounter: Payer: Self-pay | Admitting: Obstetrics & Gynecology

## 2024-01-24 ENCOUNTER — Ambulatory Visit: Payer: Self-pay | Admitting: Obstetrics & Gynecology

## 2024-01-24 VITALS — BP 99/67 | HR 67 | Ht 60.0 in | Wt 125.0 lb

## 2024-01-24 DIAGNOSIS — Z3046 Encounter for surveillance of implantable subdermal contraceptive: Secondary | ICD-10-CM

## 2024-01-24 DIAGNOSIS — Z3202 Encounter for pregnancy test, result negative: Secondary | ICD-10-CM

## 2024-01-24 LAB — POCT URINE PREGNANCY: Preg Test, Ur: NEGATIVE

## 2024-01-24 MED ORDER — ETONOGESTREL 68 MG ~~LOC~~ IMPL
68.0000 mg | DRUG_IMPLANT | Freq: Once | SUBCUTANEOUS | Status: AC
  Administered 2024-01-24: 68 mg via SUBCUTANEOUS

## 2024-01-24 NOTE — Progress Notes (Signed)
    GYNECOLOGY PROGRESS NOTE  Subjective:    Patient ID: Elora LITTIE Lady, female    DOB: [birth year], 33 y.o.   MRN: [id number]  HPI  Patient is a 33 y.o. [id number] (3 and 5 yo kids) here to have her newly expiring Nexplanon  replaced with a new one. She has minimal bleeding and is happy with this method.  The following portions of the patient's history were reviewed and updated as appropriate: allergies, current medications, past family history, past medical history, past social history, past surgical history, and problem list.  Review of Systems Pertinent items are noted in HPI.  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 Body mass index is 24.41 kg/m. Well nourished, well hydrated Black female, no apparent distress She is ambulating and conversing normally, bubbly and charming. Consent was signed and time out was done. Her left arm was prepped with betadine after establishing the position of the Nexplanon . It was then sprayed with Hurricaine spray. The area was infiltrated with 2 cc of 1% lidocaine . A small incision was made and the intact rod was easily removed and noted to be intact.  I then placed a new Nexplanon  using the same incision in the standard fashion. A steristrip was placed and her arm was noted to be hemostatic. It was bandaged.  She tolerated the procedure well.     Assessment:   1. Encounter for removal and reinsertion of Nexplanon       Plan:   1. Encounter for removal and reinsertion of Nexplanon  (Primary)  - POCT urine pregnancy   2. Schedule annual at her convenience

## 2024-01-24 NOTE — Addendum Note (Signed)
 Addended by: Elisse Pennick on: 01/24/2024 10:46 AM   Modules accepted: Orders
